# Patient Record
Sex: Female | Born: 1990 | Race: Black or African American | Marital: Single | Smoking: Never smoker
Health system: Southern US, Community
[De-identification: ages and names within clinical notes are randomized; demographics above are authoritative.]

## PROBLEM LIST (undated history)

## (undated) DIAGNOSIS — Z789 Other specified health status: Secondary | ICD-10-CM

## (undated) HISTORY — PX: INDUCED ABORTION: SHX677

---

## 2018-03-11 DIAGNOSIS — R87810 Cervical high risk human papillomavirus (HPV) DNA test positive: Secondary | ICD-10-CM | POA: Insufficient documentation

## 2018-03-11 DIAGNOSIS — R87610 Atypical squamous cells of undetermined significance on cytologic smear of cervix (ASC-US): Secondary | ICD-10-CM | POA: Insufficient documentation

## 2018-05-27 NOTE — L&D Delivery Note (Signed)
Delivery Note FHR became difficult to hear.  I examined her and found fetal vertex at the introitus Dr Sarita Haver had just arrive to do a NICU consult with patient so the rest of her team was called and we waited to deliver until they were set up.   She pushed over 2-3 contractions to deliver spontaneously.  Baby delivered crying and with excellent tone.  Dr Sarita Haver suggested proceeding with delayed cord clamping. While we waiting, she examined the baby, dried him and put his hat on.  Cord clamped away from abdomen and double clamped and cut by FOB  At 7:18 AM a viable and healthy female was delivered via Vaginal, Spontaneous (Presentation: ;  ).  APGAR: , ; weight 2 lb 6.5 oz (1090 g).   Placenta status: spontaneous and grossly intact with 3 vessel Cord:  with the following complications: none              Cord pH: pending  Anesthesia:  Epidural  Episiotomy:  none Lacerations:  none Suture Repair: n/a Est. Blood Loss (mL):  150  Mom to postpartum.  Baby to Couplet care / Skin to Skin.  Lauren Hawkins 12/24/2018, 7:43 AM  HP Please schedule this patient for Postpartum visit in: 4 weeks with the following provider: Any provider For C/S patients schedule nurse incision check in weeks 2 weeks: no Low risk pregnancy complicated by: Preterm birth Delivery mode:  SVD Anticipated Birth Control:  other/unsure PP Procedures needed: None  Schedule Integrated BH visit: no

## 2018-09-01 ENCOUNTER — Encounter: Payer: Self-pay | Admitting: Advanced Practice Midwife

## 2018-09-09 ENCOUNTER — Other Ambulatory Visit: Payer: Self-pay

## 2018-09-09 ENCOUNTER — Encounter: Payer: Self-pay | Admitting: Obstetrics & Gynecology

## 2018-09-09 ENCOUNTER — Other Ambulatory Visit (HOSPITAL_COMMUNITY)
Admission: RE | Admit: 2018-09-09 | Discharge: 2018-09-09 | Disposition: A | Discharge status: Home / Self Care | Payer: 59 | Source: Ambulatory Visit | Attending: Advanced Practice Midwife | Admitting: Advanced Practice Midwife

## 2018-09-09 ENCOUNTER — Ambulatory Visit (INDEPENDENT_AMBULATORY_CARE_PROVIDER_SITE_OTHER): Payer: 59 | Admitting: Obstetrics & Gynecology

## 2018-09-09 DIAGNOSIS — Z113 Encounter for screening for infections with a predominantly sexual mode of transmission: Secondary | ICD-10-CM | POA: Diagnosis not present

## 2018-09-09 DIAGNOSIS — Z9889 Other specified postprocedural states: Secondary | ICD-10-CM | POA: Insufficient documentation

## 2018-09-09 DIAGNOSIS — Z8742 Personal history of other diseases of the female genital tract: Secondary | ICD-10-CM | POA: Diagnosis present

## 2018-09-09 DIAGNOSIS — O3680X0 Pregnancy with inconclusive fetal viability, not applicable or unspecified: Secondary | ICD-10-CM

## 2018-09-09 DIAGNOSIS — M5432 Sciatica, left side: Secondary | ICD-10-CM | POA: Insufficient documentation

## 2018-09-09 DIAGNOSIS — Z3400 Encounter for supervision of normal first pregnancy, unspecified trimester: Secondary | ICD-10-CM

## 2018-09-09 DIAGNOSIS — Z3A12 12 weeks gestation of pregnancy: Secondary | ICD-10-CM | POA: Diagnosis not present

## 2018-09-09 LAB — POCT URINALYSIS DIPSTICK OB
Bilirubin, UA: NEGATIVE
Blood, UA: NEGATIVE
Glucose, UA: NEGATIVE
Ketones, UA: NEGATIVE
Nitrite, UA: NEGATIVE
POC,PROTEIN,UA: NEGATIVE
Spec Grav, UA: 1.015 (ref 1.010–1.025)
pH, UA: 6.5 (ref 5.0–8.0)

## 2018-09-09 NOTE — Patient Instructions (Signed)
Sciatica    Sciatica is pain, numbness, weakness, or tingling along the path of the sciatic nerve. The sciatic nerve starts in the lower back and runs down the back of each leg. The nerve controls the muscles in the lower leg and in the back of the knee. It also provides feeling (sensation) to the back of the thigh, the lower leg, and the sole of the foot. Sciatica is a symptom of another medical condition that pinches or puts pressure on the sciatic nerve.  Generally, sciatica only affects one side of the body. Sciatica usually goes away on its own or with treatment. In some cases, sciatica may keep coming back (recur).  What are the causes?  This condition is caused by pressure on the sciatic nerve, or pinching of the sciatic nerve. This may be the result of:   A disk in between the bones of the spine (vertebrae) bulging out too far (herniated disk).   Age-related changes in the spinal disks (degenerative disk disease).   A pain disorder that affects a muscle in the buttock (piriformis syndrome).   Extra bone growth (bone spur) near the sciatic nerve.   An injury or break (fracture) of the pelvis.   Pregnancy.   Tumor (rare).  What increases the risk?  The following factors may make you more likely to develop this condition:   Playing sports that place pressure or stress on the spine, such as football or weight lifting.   Having poor strength and flexibility.   A history of back injury.   A history of back surgery.   Sitting for long periods of time.   Doing activities that involve repetitive bending or lifting.   Obesity.  What are the signs or symptoms?  Symptoms can vary from mild to very severe, and they may include:   Any of these problems in the lower back, leg, hip, or buttock:  ? Mild tingling or dull aches.  ? Burning sensations.  ? Sharp pains.   Numbness in the back of the calf or the sole of the foot.   Leg weakness.   Severe back pain that makes movement difficult.  These symptoms  may get worse when you cough, sneeze, or laugh, or when you sit or stand for long periods of time. Being overweight may also make symptoms worse. In some cases, symptoms may recur over time.  How is this diagnosed?  This condition may be diagnosed based on:   Your symptoms.   A physical exam. Your health care provider may ask you to do certain movements to check whether those movements trigger your symptoms.   You may have tests, including:  ? Blood tests.  ? X-rays.  ? MRI.  ? CT scan.  How is this treated?  In many cases, this condition improves on its own, without any treatment. However, treatment may include:   Reducing or modifying physical activity during periods of pain.   Exercising and stretching to strengthen your abdomen and improve the flexibility of your spine.   Icing and applying heat to the affected area.   Medicines that help:  ? To relieve pain and swelling.  ? To relax your muscles.   Injections of medicines that help to relieve pain, irritation, and inflammation around the sciatic nerve (steroids).   Surgery.  Follow these instructions at home:  Medicines   Take over-the-counter and prescription medicines only as told by your health care provider.   Do not drive or operate heavy   area before you exercise or as often as told by your health care provider. Use the heat source that your health care provider recommends, such as a moist heat pack or a heating pad. ? Place a towel between your skin and the heat source. ? Leave the heat on for 20-30 minutes. ? Remove the heat if your skin turns bright red. This is especially important if you are unable to feel pain, heat, or cold. You may have a greater risk  of getting burned. Activity  Return to your normal activities as told by your health care provider. Ask your health care provider what activities are safe for you. ? Avoid activities that make your symptoms worse.  Take brief periods of rest throughout the day. Resting in a lying or standing position is usually better than sitting to rest. ? When you rest for longer periods, mix in some mild activity or stretching between periods of rest. This will help to prevent stiffness and pain. ? Avoid sitting for long periods of time without moving. Get up and move around at least one time each hour.  Exercise and stretch regularly, as told by your health care provider.  Do not lift anything that is heavier than 10 lb (4.5 kg) while you have symptoms of sciatica. When you do not have symptoms, you should still avoid heavy lifting, especially repetitive heavy lifting.  When you lift objects, always use proper lifting technique, which includes: ? Bending your knees. ? Keeping the load close to your body. ? Avoiding twisting. General instructions  Use good posture. ? Avoid leaning forward while sitting. ? Avoid hunching over while standing.  Maintain a healthy weight. Excess weight puts extra stress on your back and makes it difficult to maintain good posture.  Wear supportive, comfortable shoes. Avoid wearing high heels.  Avoid sleeping on a mattress that is too soft or too hard. A mattress that is firm enough to support your back when you sleep may help to reduce your pain.  Keep all follow-up visits as told by your health care provider. This is important. Contact a health care provider if:  You have pain that wakes you up when you are sleeping.  You have pain that gets worse when you lie down.  Your pain is worse than you have experienced in the past.  Your pain lasts longer than 4 weeks.  You experience unexplained weight loss. Get help right away if:  You lose control of your  bowel or bladder (incontinence).  You have: ? Weakness in your lower back, pelvis, buttocks, or legs that gets worse. ? Redness or swelling of your back. ? A burning sensation when you urinate. This information is not intended to replace advice given to you by your health care provider. Make sure you discuss any questions you have with your health care provider. Document Released: 05/07/2001 Document Revised: 10/17/2015 Document Reviewed: 01/20/2015 Elsevier Interactive Patient Education  2019 [employer].    Second Trimester of Pregnancy The second trimester is from week 14 through week 27 (months 4 through 6). The second trimester is often a time when you feel your best. Your body has adjusted to being pregnant, and you begin to feel better physically. Usually, morning sickness has lessened or quit completely, you may have more energy, and you may have an increase in appetite. The second trimester is also a time when the fetus is growing rapidly. At the end of the sixth month, the fetus is about 9 inches  long and weighs about 1 pounds. You will likely begin to feel the baby move (quickening) between 16 and 20 weeks of pregnancy. Body changes during your second trimester Your body continues to go through many changes during your second trimester. The changes vary from woman to woman.  Your weight will continue to increase. You will notice your lower abdomen bulging out.  You may begin to get stretch marks on your hips, abdomen, and breasts.  You may develop headaches that can be relieved by medicines. The medicines should be approved by your health care provider.  You may urinate more often because the fetus is pressing on your bladder.  You may develop or continue to have heartburn as a result of your pregnancy.  You may develop constipation because certain hormones are causing the muscles that push waste through your intestines to slow down.  You may develop hemorrhoids or swollen,  bulging veins (varicose veins).  You may have back pain. This is caused by: ? Weight gain. ? Pregnancy hormones that are relaxing the joints in your pelvis. ? A shift in weight and the muscles that support your balance.  Your breasts will continue to grow and they will continue to become tender.  Your gums may bleed and may be sensitive to brushing and flossing.  Dark spots or blotches (chloasma, mask of pregnancy) may develop on your face. This will likely fade after the baby is born.  A dark line from your belly button to the pubic area (linea nigra) may appear. This will likely fade after the baby is born.  You may have changes in your hair. These can include thickening of your hair, rapid growth, and changes in texture. Some women also have hair loss during or after pregnancy, or hair that feels dry or thin. Your hair will most likely return to normal after your baby is born. What to expect at prenatal visits During a routine prenatal visit:  You will be weighed to make sure you and the fetus are growing normally.  Your blood pressure will be taken.  Your abdomen will be measured to track your baby's growth.  The fetal heartbeat will be listened to.  Any test results from the previous visit will be discussed. Your health care provider may ask you:  How you are feeling.  If you are feeling the baby move.  If you have had any abnormal symptoms, such as leaking fluid, bleeding, severe headaches, or abdominal cramping.  If you are using any tobacco products, including cigarettes, chewing tobacco, and electronic cigarettes.  If you have any questions. Other tests that may be performed during your second trimester include:  Blood tests that check for: ? Low iron levels (anemia). ? High blood sugar that affects pregnant women (gestational diabetes) between 724 and 28 weeks. ? Rh antibodies. This is to check for a protein on red blood cells (Rh factor).  Urine tests to check  for infections, diabetes, or protein in the urine.  An ultrasound to confirm the proper growth and development of the baby.  An amniocentesis to check for possible genetic problems.  Fetal screens for spina bifida and Down syndrome.  HIV (human immunodeficiency virus) testing. Routine prenatal testing includes screening for HIV, unless you choose not to have this test. Follow these instructions at home: Medicines  Follow your health care provider's instructions regarding medicine use. Specific medicines may be either safe or unsafe to take during pregnancy.  Take a prenatal vitamin that contains at least  600 micrograms (mcg) of folic acid.  If you develop constipation, try taking a stool softener if your health care provider approves. Eating and drinking   Eat a balanced diet that includes fresh fruits and vegetables, whole grains, good sources of protein such as meat, eggs, or tofu, and low-fat dairy. Your health care provider will help you determine the amount of weight gain that is right for you.  Avoid raw meat and uncooked cheese. These carry germs that can cause birth defects in the baby.  If you have low calcium intake from food, talk to your health care provider about whether you should take a daily calcium supplement.  Limit foods that are high in fat and processed sugars, such as fried and sweet foods.  To prevent constipation: ? Drink enough fluid to keep your urine clear or pale yellow. ? Eat foods that are high in fiber, such as fresh fruits and vegetables, whole grains, and beans. Activity  Exercise only as directed by your health care provider. Most women can continue their usual exercise routine during pregnancy. Try to exercise for 30 minutes at least 5 days a week. Stop exercising if you experience uterine contractions.  Avoid heavy lifting, wear low heel shoes, and practice good posture.  A sexual relationship may be continued unless your health care provider  directs you otherwise. Relieving pain and discomfort  Wear a good support bra to prevent discomfort from breast tenderness.  Take warm sitz baths to soothe any pain or discomfort caused by hemorrhoids. Use hemorrhoid cream if your health care provider approves.  Rest with your legs elevated if you have leg cramps or low back pain.  If you develop varicose veins, wear support hose. Elevate your feet for 15 minutes, 3-4 times a day. Limit salt in your diet. Prenatal Care  Write down your questions. Take them to your prenatal visits.  Keep all your prenatal visits as told by your health care provider. This is important. Safety  Wear your seat belt at all times when driving.  Make a list of emergency phone numbers, including numbers for family, friends, the hospital, and police and fire departments. General instructions  Ask your health care provider for a referral to a local prenatal education class. Begin classes no later than the beginning of month 6 of your pregnancy.  Ask for help if you have counseling or nutritional needs during pregnancy. Your health care provider can offer advice or refer you to specialists for help with various needs.  Do not use hot tubs, steam rooms, or saunas.  Do not douche or use tampons or scented sanitary pads.  Do not cross your legs for long periods of time.  Avoid cat litter boxes and soil used by cats. These carry germs that can cause birth defects in the baby and possibly loss of the fetus by miscarriage or stillbirth.  Avoid all smoking, herbs, alcohol, and unprescribed drugs. Chemicals in these products can affect the formation and growth of the baby.  Do not use any products that contain nicotine or tobacco, such as cigarettes and e-cigarettes. If you need help quitting, ask your health care provider.  Visit your dentist if you have not gone yet during your pregnancy. Use a soft toothbrush to brush your teeth and be gentle when you  floss. Contact a health care provider if:  You have dizziness.  You have mild pelvic cramps, pelvic pressure, or nagging pain in the abdominal area.  You have persistent nausea, vomiting, or  diarrhea.  You have a bad smelling vaginal discharge.  You have pain when you urinate. Get help right away if:  You have a fever.  You are leaking fluid from your vagina.  You have spotting or bleeding from your vagina.  You have severe abdominal cramping or pain.  You have rapid weight gain or weight loss.  You have shortness of breath with chest pain.  You notice sudden or extreme swelling of your face, hands, ankles, feet, or legs.  You have not felt your baby move in over an hour.  You have severe headaches that do not go away when you take medicine.  You have vision changes. Summary  The second trimester is from week 14 through week 27 (months 4 through 6). It is also a time when the fetus is growing rapidly.  Your body goes through many changes during pregnancy. The changes vary from woman to woman.  Avoid all smoking, herbs, alcohol, and unprescribed drugs. These chemicals affect the formation and growth your baby.  Do not use any tobacco products, such as cigarettes, chewing tobacco, and e-cigarettes. If you need help quitting, ask your health care provider.  Contact your health care provider if you have any questions. Keep all prenatal visits as told by your health care provider. This is important. This information is not intended to replace advice given to you by your health care provider. Make sure you discuss any questions you have with your health care provider. Document Released: 05/07/2001 Document Revised: 06/18/2016 Document Reviewed: 06/18/2016 Elsevier Interactive Patient Education  2019 [employer].

## 2018-09-09 NOTE — Progress Notes (Signed)
DATING AND VIABILITY SONOGRAM   Lauren Hawkins is a 28 y.o. year old G1P0 with LMP Patient's last menstrual period was 06/11/2018 (exact date). which would correlate to  [redacted]w[redacted]d weeks gestation.  She has regular menstrual cycles.   She is here today for a confirmatory initial sonogram.    GESTATION: SINGLETON     FETAL ACTIVITY:          Heart rate         160 bpm          The fetus is active.   ADNEXA: The ovaries are normal.   GESTATIONAL AGE AND  BIOMETRICS:  Gestational criteria: Estimated Date of Delivery: 03/18/19 by LMP now at [redacted]w[redacted]d  Previous Scans:0  HC           8.38 cm        13-4weeks  GSD           6.64 cm       13-0 weeks                                                                               AVERAGE EGA(BY THIS SCAN): 13.1 weeks  WORKING EDD( early ultrasound ):  03/18/2019     TECHNICIAN COMMENTS:  Patient informed that the ultrasound is considered a limited obstetric ultrasound and is not intended to be a complete ultrasound exam. Patient also informed that the ultrasound is not being completed with the intent of assessing for fetal or placental anomalies or any pelvic abnormalities. Explained that the purpose of today's ultrasound is to assess for fetal heart rate. Patient acknowledges the purpose of the exam and the limitations of the study.       Armandina Stammer 09/09/2018 3:51 PM

## 2018-09-09 NOTE — Progress Notes (Signed)
  Subjective:    Lauren Hawkins is being seen today for her first obstetrical visit.  This is not a planned pregnancy. She is at [redacted]w[redacted]d gestation. Her obstetrical history is significant for nausea- improved. Relationship with FOB: significant other. Patient does intend to breast feed. Pregnancy history fully reviewed.  Patient reports nausea.  Review of Systems:   Review of Systems  Objective:     BP (!) 113/55   Pulse 84   Temp 97.8 F (36.6 C)   Ht 5\' 4"  (1.626 m)   Wt 211 lb 0.6 oz (95.7 kg)   LMP 06/11/2018 (Exact Date)   BMI 36.22 kg/m  Physical Exam  Exam General Appearance:    Alert, cooperative, no distress, appears stated age  Head:    Normocephalic, without obvious abnormality, atraumatic  Eyes:    conjunctiva/corneas clear, EOM's intact, both eyes  Ears:    Normal external ear canals, both ears  Nose:   Nares normal, septum midline, mucosa normal, no drainage    or sinus tenderness  Throat:   Lips, mucosa, and tongue normal; teeth and gums normal  Neck:   Supple, symmetrical, trachea midline, no adenopathy;    thyroid:  no enlargement/tenderness/nodules  Back:     Symmetric, no curvature, ROM normal, no CVA tenderness     Chest Wall:    No tenderness or deformity     Breast Exam:    No tenderness, masses, or nipple abnormality  Abdomen:     Soft, non-tender, bowel sounds active all four quadrants,    no masses, no organomegaly  Genitalia:    Normal female without lesion, discharge or tenderness   Cervical polyp noted  Extremities:   Extremities normal, atraumatic, no cyanosis or edema  Pulses:   2+ and symmetric all extremities  Skin:   Skin color, texture, turgor normal, no rashes or lesions      Assessment:    Pregnancy: G1P0 Patient Active Problem List   Diagnosis Date Noted  . Encounter for supervision of normal first pregnancy 09/09/2018  Sciatica     Plan:     Initial labs drawn. Prenatal vitamins. Problem list reviewed and updated. AFP3  discussed: requested. Role of ultrasound in pregnancy discussed; fetal survey: requested. Amniocentesis discussed: not indicated. Follow up in 5 weeks. 60 min visit spent on counseling and coordination of care.   Willodean Rosenthal 09/09/2018

## 2018-09-11 LAB — OBSTETRIC PANEL, INCLUDING HIV
Antibody Screen: NEGATIVE
Basophils Absolute: 0 10*3/uL (ref 0.0–0.2)
Basos: 0 %
EOS (ABSOLUTE): 0.1 10*3/uL (ref 0.0–0.4)
Eos: 1 %
HIV Screen 4th Generation wRfx: NONREACTIVE
Hematocrit: 38.2 % (ref 34.0–46.6)
Hemoglobin: 13 g/dL (ref 11.1–15.9)
Hepatitis B Surface Ag: NEGATIVE
Immature Grans (Abs): 0 10*3/uL (ref 0.0–0.1)
Immature Granulocytes: 0 %
Lymphocytes Absolute: 2 10*3/uL (ref 0.7–3.1)
Lymphs: 19 %
MCH: 30 pg (ref 26.6–33.0)
MCHC: 34 g/dL (ref 31.5–35.7)
MCV: 88 fL (ref 79–97)
Monocytes Absolute: 0.6 10*3/uL (ref 0.1–0.9)
Monocytes: 6 %
Neutrophils Absolute: 7.6 10*3/uL — ABNORMAL HIGH (ref 1.4–7.0)
Neutrophils: 74 %
Platelets: 246 10*3/uL (ref 150–450)
RBC: 4.33 x10E6/uL (ref 3.77–5.28)
RDW: 13 % (ref 11.7–15.4)
RPR Ser Ql: NONREACTIVE
Rh Factor: POSITIVE
Rubella Antibodies, IGG: 6.45 index (ref >0.99)
WBC: 10.4 10*3/uL (ref 3.4–10.8)

## 2018-09-11 LAB — HEMOGLOBINOPATHY EVALUATION
Ferritin: 47 ng/mL (ref 15–150)
Hgb A2 Quant: 2.5 % (ref 1.8–3.2)
Hgb A: 97.5 % (ref 96.4–98.8)
Hgb C: 0 %
Hgb F Quant: 0 % (ref 0.0–2.0)
Hgb S: 0 %
Hgb Solubility: NEGATIVE
Hgb Variant: 0 %

## 2018-09-11 LAB — HEMOGLOBIN A1C
Est. average glucose Bld gHb Est-mCnc: 94 mg/dL
Hgb A1c MFr Bld: 4.9 % (ref 4.8–5.6)

## 2018-09-12 LAB — CULTURE, OB URINE

## 2018-09-12 LAB — GC/CHLAMYDIA PROBE AMP (~~LOC~~) NOT AT ARMC
Chlamydia: NEGATIVE
Neisseria Gonorrhea: NEGATIVE

## 2018-09-12 LAB — URINE CULTURE, OB REFLEX

## 2018-10-01 ENCOUNTER — Other Ambulatory Visit: Payer: Self-pay

## 2018-10-01 ENCOUNTER — Ambulatory Visit (INDEPENDENT_AMBULATORY_CARE_PROVIDER_SITE_OTHER): Payer: 59 | Admitting: Family Medicine

## 2018-10-01 VITALS — BP 101/54 | HR 109 | Wt 213.0 lb

## 2018-10-01 DIAGNOSIS — Z3400 Encounter for supervision of normal first pregnancy, unspecified trimester: Secondary | ICD-10-CM

## 2018-10-01 DIAGNOSIS — M5432 Sciatica, left side: Secondary | ICD-10-CM

## 2018-10-01 DIAGNOSIS — Z3A16 16 weeks gestation of pregnancy: Secondary | ICD-10-CM

## 2018-10-01 DIAGNOSIS — M9903 Segmental and somatic dysfunction of lumbar region: Secondary | ICD-10-CM

## 2018-10-01 DIAGNOSIS — Z3402 Encounter for supervision of normal first pregnancy, second trimester: Secondary | ICD-10-CM

## 2018-10-01 NOTE — Progress Notes (Signed)
   PRENATAL VISIT NOTE  Subjective:  Lauren Hawkins is a 28 y.o. G1P0 at [redacted]w[redacted]d being seen today for ongoing prenatal care.  She is currently monitored for the following issues for this low-risk pregnancy and has Encounter for supervision of normal first pregnancy; Sciatica of left side; and S/P cervical polypectomy on their problem list.  Patient reports left sciatic pain, radiate to lower buttock..  Contractions: Not present. Vag. Bleeding: None.  Movement: Absent. Denies leaking of fluid.   The following portions of the patient's history were reviewed and updated as appropriate: allergies, current medications, past family history, past medical history, past social history, past surgical history and problem list. Problem list updated.  Objective:   Vitals:   10/01/18 1502  BP: (!) 101/54  Pulse: (!) 109  Weight: 213 lb (96.6 kg)    Fetal Status: Fetal Heart Rate (bpm): 162   Movement: Absent     General:  Alert, oriented and cooperative. Patient is in no acute distress.  Skin: Skin is warm and dry. No rash noted.   Cardiovascular: Normal heart rate noted  Respiratory: Normal respiratory effort, no problems with respiration noted  Abdomen: Soft, gravid, appropriate for gestational age. Pain/Pressure: Absent     Pelvic:  Cervical exam deferred        MSK: Restriction, tenderness, tissue texture changes, and paraspinal spasm in the lumbar paraspinal  Neuro: Moves all four extremities with no focal neurological deficit  Extremities: Normal range of motion.  Edema: None  Mental Status: Normal mood and affect. Normal behavior. Normal judgment and thought content.   OSE: Head   Cervical   Thoracic   Rib   Lumbar L1 ESRL, L5 ESRL  Sacrum L/L  Pelvis Right ant innom    Assessment and Plan:  Pregnancy: G1P0 at [redacted]w[redacted]d  1. Supervision of normal first pregnancy, antepartum Panorama today - Genetic Screening  2. Sciatica of left side 3. Somatic dysfunction of lumbar region OMT done  after patient permission. HVLA technique utilized. 3 areas treated with improvement of tissue texture and joint mobility. Patient tolerated procedure well.     Preterm labor symptoms and general obstetric precautions including but not limited to vaginal bleeding, contractions, leaking of fluid and fetal movement were reviewed in detail with the patient. Please refer to After Visit Summary for other counseling recommendations.  Return in about 6 weeks (around 11/12/2018) for Virtual, OB f/u.  Levie Heritage, DO

## 2018-10-12 ENCOUNTER — Telehealth: Payer: Self-pay

## 2018-10-12 NOTE — Telephone Encounter (Signed)
Patient called and made aware of low risk panorama.  Patient will come by and pick up results in sealed envelope. Armandina Stammer RN

## 2018-10-14 ENCOUNTER — Encounter: Payer: 59 | Admitting: Obstetrics & Gynecology

## 2018-10-15 ENCOUNTER — Encounter: Payer: 59 | Admitting: Obstetrics & Gynecology

## 2018-10-22 ENCOUNTER — Encounter (HOSPITAL_COMMUNITY): Payer: Self-pay

## 2018-10-22 ENCOUNTER — Ambulatory Visit (HOSPITAL_COMMUNITY): Payer: 59

## 2018-10-26 ENCOUNTER — Other Ambulatory Visit: Payer: Self-pay

## 2018-10-26 ENCOUNTER — Other Ambulatory Visit: Payer: Self-pay | Admitting: Obstetrics & Gynecology

## 2018-10-26 ENCOUNTER — Ambulatory Visit (HOSPITAL_COMMUNITY)
Admission: RE | Admit: 2018-10-26 | Discharge: 2018-10-26 | Disposition: A | Discharge status: Home / Self Care | Payer: 59 | Source: Ambulatory Visit | Attending: Obstetrics and Gynecology | Admitting: Obstetrics and Gynecology

## 2018-10-26 DIAGNOSIS — Z3400 Encounter for supervision of normal first pregnancy, unspecified trimester: Secondary | ICD-10-CM | POA: Diagnosis present

## 2018-10-26 DIAGNOSIS — Z363 Encounter for antenatal screening for malformations: Secondary | ICD-10-CM

## 2018-10-26 DIAGNOSIS — O3442 Maternal care for other abnormalities of cervix, second trimester: Secondary | ICD-10-CM

## 2018-10-26 DIAGNOSIS — Z3686 Encounter for antenatal screening for cervical length: Secondary | ICD-10-CM

## 2018-10-26 DIAGNOSIS — Z3A19 19 weeks gestation of pregnancy: Secondary | ICD-10-CM

## 2018-10-26 DIAGNOSIS — O99212 Obesity complicating pregnancy, second trimester: Secondary | ICD-10-CM | POA: Diagnosis not present

## 2018-10-27 ENCOUNTER — Other Ambulatory Visit (HOSPITAL_COMMUNITY): Payer: Self-pay | Admitting: *Deleted

## 2018-10-27 DIAGNOSIS — Z362 Encounter for other antenatal screening follow-up: Secondary | ICD-10-CM

## 2018-11-13 ENCOUNTER — Ambulatory Visit (INDEPENDENT_AMBULATORY_CARE_PROVIDER_SITE_OTHER): Payer: 59 | Admitting: Family Medicine

## 2018-11-13 ENCOUNTER — Encounter: Payer: Self-pay | Admitting: Family Medicine

## 2018-11-13 VITALS — BP 102/69 | Wt 220.0 lb

## 2018-11-13 DIAGNOSIS — Z3402 Encounter for supervision of normal first pregnancy, second trimester: Secondary | ICD-10-CM

## 2018-11-13 DIAGNOSIS — Z3400 Encounter for supervision of normal first pregnancy, unspecified trimester: Secondary | ICD-10-CM

## 2018-11-13 DIAGNOSIS — Z3A22 22 weeks gestation of pregnancy: Secondary | ICD-10-CM

## 2018-11-13 MED ORDER — CLOTRIMAZOLE 1 % EX CREA: 1.0000 "application " | TOPICAL_CREAM | Freq: Two times a day (BID) | CUTANEOUS | 0 refills | Status: DC

## 2018-11-13 NOTE — Progress Notes (Signed)
   PRENATAL VISIT NOTE  Subjective:  Lauren Hawkins is a 28 y.o. G1P0 at [redacted]w[redacted]d being seen today for ongoing prenatal care.  She is currently monitored for the following issues for this low-risk pregnancy and has Encounter for supervision of normal first pregnancy; Sciatica of left side; and S/P cervical polypectomy on their problem list.  Patient reports lower abdominal and pelvic pain - worse when standing and walking.  Contractions: Not present. Vag. Bleeding: None.  Movement: Present. Denies leaking of fluid.   The following portions of the patient's history were reviewed and updated as appropriate: allergies, current medications, past family history, past medical history, past social history, past surgical history and problem list.   Objective:   Vitals:   11/13/18 1050  BP: 102/69  Weight: 220 lb (99.8 kg)    Fetal Status:     Movement: Present     General:  Alert, oriented and cooperative. Patient is in no acute distress.  Skin: Skin is warm and dry. No rash noted.   Cardiovascular: Normal heart rate noted  Respiratory: Normal respiratory effort, no problems with respiration noted  Abdomen: Soft, gravid, appropriate for gestational age.  Pain/Pressure: Present     Pelvic: Cervical exam deferred        Extremities: Normal range of motion.  Edema: None  Mental Status: Normal mood and affect. Normal behavior. Normal judgment and thought content.   Assessment and Plan:  Pregnancy: G1P0 at [redacted]w[redacted]d 1. Supervision of normal first pregnancy, antepartum FHT and FH normal. Pregnancy belt recommended.  Preterm labor symptoms and general obstetric precautions including but not limited to vaginal bleeding, contractions, leaking of fluid and fetal movement were reviewed in detail with the patient. Please refer to After Visit Summary for other counseling recommendations.   Return in about 4 weeks (around 12/11/2018) for OB f/u, 2 hr GTT, In Office.  Future Appointments  Date Time Provider  [place]  11/24/2018  3:30 PM [place] [facility]  11/24/2018  3:30 PM [place] [facility]    Truett Mainland, DO

## 2018-11-24 ENCOUNTER — Ambulatory Visit (HOSPITAL_COMMUNITY): Payer: 59

## 2018-11-24 ENCOUNTER — Ambulatory Visit (HOSPITAL_COMMUNITY)
Admission: RE | Admit: 2018-11-24 | Discharge: 2018-11-24 | Disposition: A | Discharge status: Home / Self Care | Payer: 59 | Source: Ambulatory Visit | Attending: Obstetrics and Gynecology | Admitting: Obstetrics and Gynecology

## 2018-11-24 ENCOUNTER — Encounter (HOSPITAL_COMMUNITY): Payer: Self-pay

## 2018-12-07 ENCOUNTER — Ambulatory Visit (HOSPITAL_COMMUNITY)
Admission: RE | Admit: 2018-12-07 | Discharge: 2018-12-07 | Disposition: A | Discharge status: Home / Self Care | Payer: 59 | Source: Ambulatory Visit | Attending: Obstetrics and Gynecology | Admitting: Obstetrics and Gynecology

## 2018-12-07 ENCOUNTER — Encounter (HOSPITAL_COMMUNITY): Payer: Self-pay | Admitting: *Deleted

## 2018-12-07 ENCOUNTER — Ambulatory Visit (HOSPITAL_COMMUNITY): Payer: 59 | Admitting: *Deleted

## 2018-12-07 ENCOUNTER — Other Ambulatory Visit: Payer: Self-pay

## 2018-12-07 DIAGNOSIS — O99212 Obesity complicating pregnancy, second trimester: Secondary | ICD-10-CM | POA: Diagnosis not present

## 2018-12-07 DIAGNOSIS — Z8742 Personal history of other diseases of the female genital tract: Secondary | ICD-10-CM

## 2018-12-07 DIAGNOSIS — Z9889 Other specified postprocedural states: Secondary | ICD-10-CM | POA: Diagnosis present

## 2018-12-07 DIAGNOSIS — Z362 Encounter for other antenatal screening follow-up: Secondary | ICD-10-CM | POA: Insufficient documentation

## 2018-12-07 DIAGNOSIS — M5432 Sciatica, left side: Secondary | ICD-10-CM | POA: Diagnosis present

## 2018-12-07 DIAGNOSIS — Z3400 Encounter for supervision of normal first pregnancy, unspecified trimester: Secondary | ICD-10-CM | POA: Insufficient documentation

## 2018-12-07 DIAGNOSIS — Z3A25 25 weeks gestation of pregnancy: Secondary | ICD-10-CM

## 2018-12-07 DIAGNOSIS — O3442 Maternal care for other abnormalities of cervix, second trimester: Secondary | ICD-10-CM

## 2018-12-11 ENCOUNTER — Encounter: Payer: 59 | Admitting: Obstetrics & Gynecology

## 2018-12-15 ENCOUNTER — Other Ambulatory Visit: Payer: Self-pay

## 2018-12-15 ENCOUNTER — Encounter: Payer: Self-pay | Admitting: Advanced Practice Midwife

## 2018-12-15 ENCOUNTER — Ambulatory Visit (INDEPENDENT_AMBULATORY_CARE_PROVIDER_SITE_OTHER): Payer: 59 | Admitting: Advanced Practice Midwife

## 2018-12-15 VITALS — BP 107/65 | HR 89 | Wt 226.0 lb

## 2018-12-15 DIAGNOSIS — Z3400 Encounter for supervision of normal first pregnancy, unspecified trimester: Secondary | ICD-10-CM

## 2018-12-15 DIAGNOSIS — Z3A26 26 weeks gestation of pregnancy: Secondary | ICD-10-CM

## 2018-12-15 DIAGNOSIS — Z3402 Encounter for supervision of normal first pregnancy, second trimester: Secondary | ICD-10-CM

## 2018-12-15 NOTE — Progress Notes (Signed)
   PRENATAL VISIT NOTE  Subjective:  Lauren Hawkins is a 28 y.o. G1P0 at [redacted]w[redacted]d being seen today for ongoing prenatal care.  She is currently monitored for the following issues for this low-risk pregnancy and has Encounter for supervision of normal first pregnancy; Sciatica of left side; and S/P cervical polypectomy on their problem list.  Patient reports no complaints.  Contractions: Not present. Vag. Bleeding: None.  Movement: Present. Denies leaking of fluid.   The following portions of the patient's history were reviewed and updated as appropriate: allergies, current medications, past family history, past medical history, past social history, past surgical history and problem list.   Objective:   Vitals:   12/15/18 0956  BP: 107/65  Pulse: 89  Weight: 102.5 kg    Fetal Status:     Movement: Present     General:  Alert, oriented and cooperative. Patient is in no acute distress.  Skin: Skin is warm and dry. No rash noted.   Cardiovascular: Normal heart rate noted  Respiratory: Normal respiratory effort, no problems with respiration noted  Abdomen: Soft, gravid, appropriate for gestational age.  Pain/Pressure: Present     Pelvic: Cervical exam deferred        Extremities: Normal range of motion.  Edema: Trace  Mental Status: Normal mood and affect. Normal behavior. Normal judgment and thought content.   Assessment and Plan:  Pregnancy: G1P0 at [redacted]w[redacted]d . Preterm labor symptoms and general obstetric precautions including but not limited to vaginal bleeding, contractions, leaking of fluid and fetal movement were reviewed in detail with the patient. Please refer to After Visit Summary for other counseling recommendations.    RTO 01/13/2019 @ 1300hrs  Hansel Feinstein, CNM

## 2018-12-15 NOTE — Patient Instructions (Signed)

## 2018-12-16 LAB — GLUCOSE TOLERANCE, 2 HOURS W/ 1HR
Glucose, 1 hour: 136 mg/dL (ref 65–179)
Glucose, 2 hour: 90 mg/dL (ref 65–152)
Glucose, Fasting: 77 mg/dL (ref 65–91)

## 2018-12-16 LAB — CBC
Hematocrit: 39.1 % (ref 34.0–46.6)
Hemoglobin: 12.6 g/dL (ref 11.1–15.9)
MCH: 30.3 pg (ref 26.6–33.0)
MCHC: 32.2 g/dL (ref 31.5–35.7)
MCV: 94 fL (ref 79–97)
Platelets: 209 10*3/uL (ref 150–450)
RBC: 4.16 x10E6/uL (ref 3.77–5.28)
RDW: 13 % (ref 11.7–15.4)
WBC: 9.8 10*3/uL (ref 3.4–10.8)

## 2018-12-16 LAB — HIV ANTIBODY (ROUTINE TESTING W REFLEX): HIV Screen 4th Generation wRfx: NONREACTIVE

## 2018-12-16 LAB — RPR: RPR Ser Ql: NONREACTIVE

## 2018-12-22 ENCOUNTER — Other Ambulatory Visit: Payer: Self-pay

## 2018-12-22 ENCOUNTER — Encounter (HOSPITAL_COMMUNITY): Payer: Self-pay | Admitting: *Deleted

## 2018-12-22 ENCOUNTER — Inpatient Hospital Stay (EMERGENCY_DEPARTMENT_HOSPITAL)
Admission: AD | Admit: 2018-12-22 | Discharge: 2018-12-22 | Disposition: A | Discharge status: Home / Self Care | Payer: No Typology Code available for payment source | Source: Home / Self Care | Attending: Family Medicine | Admitting: Family Medicine

## 2018-12-22 DIAGNOSIS — Z3A27 27 weeks gestation of pregnancy: Secondary | ICD-10-CM

## 2018-12-22 DIAGNOSIS — B373 Candidiasis of vulva and vagina: Secondary | ICD-10-CM | POA: Insufficient documentation

## 2018-12-22 DIAGNOSIS — O98813 Other maternal infectious and parasitic diseases complicating pregnancy, third trimester: Secondary | ICD-10-CM | POA: Insufficient documentation

## 2018-12-22 DIAGNOSIS — O42913 Preterm premature rupture of membranes, unspecified as to length of time between rupture and onset of labor, third trimester: Secondary | ICD-10-CM | POA: Diagnosis not present

## 2018-12-22 DIAGNOSIS — B3731 Acute candidiasis of vulva and vagina: Secondary | ICD-10-CM

## 2018-12-22 DIAGNOSIS — Z0371 Encounter for suspected problem with amniotic cavity and membrane ruled out: Secondary | ICD-10-CM | POA: Diagnosis not present

## 2018-12-22 DIAGNOSIS — O26892 Other specified pregnancy related conditions, second trimester: Secondary | ICD-10-CM | POA: Insufficient documentation

## 2018-12-22 HISTORY — DX: Other specified health status: Z78.9

## 2018-12-22 LAB — AMNISURE RUPTURE OF MEMBRANE (ROM) NOT AT ARMC: Amnisure ROM: NEGATIVE

## 2018-12-22 LAB — WET PREP, GENITAL
Clue Cells Wet Prep HPF POC: NONE SEEN
Sperm: NONE SEEN
Trich, Wet Prep: NONE SEEN

## 2018-12-22 MED ORDER — TERCONAZOLE 0.4 % VA CREA: 1.0000 | TOPICAL_CREAM | Freq: Every day | VAGINAL | 0 refills | Status: DC

## 2018-12-22 NOTE — Discharge Instructions (Signed)
Vaginal Yeast infection, Adult  Vaginal yeast infection is a condition that causes vaginal discharge as well as soreness, swelling, and redness (inflammation) of the vagina. This is a common condition. Some women get this infection frequently. What are the causes? This condition is caused by a change in the normal balance of the yeast (candida) and bacteria that live in the vagina. This change causes an overgrowth of yeast, which causes the inflammation. What increases the risk? The condition is more likely to develop in women who:  Take antibiotic medicines.  Have diabetes.  Take birth control pills.  Are pregnant.  Douche often.  Have a weak body defense system (immune system).  Have been taking steroid medicines for a long time.  Frequently wear tight clothing. What are the signs or symptoms? Symptoms of this condition include:  White, thick, creamy vaginal discharge.  Swelling, itching, redness, and irritation of the vagina. The lips of the vagina (vulva) may be affected as well.  Pain or a burning feeling while urinating.  Pain during sex. How is this diagnosed? This condition is diagnosed based on:  Your medical history.  A physical exam.  A pelvic exam. Your health care provider will examine a sample of your vaginal discharge under a microscope. Your health care provider may send this sample for testing to confirm the diagnosis. How is this treated? This condition is treated with medicine. Medicines may be over-the-counter or prescription. You may be told to use one or more of the following:  Medicine that is taken by mouth (orally).  Medicine that is applied as a cream (topically).  Medicine that is inserted directly into the vagina (suppository). Follow these instructions at home:  Lifestyle  Do not have sex until your health care provider approves. Tell your sex partner that you have a yeast infection. That person should go to his or her health care  provider and ask if they should also be treated.  Do not wear tight clothes, such as pantyhose or tight pants.  Wear breathable cotton underwear. General instructions  Take or apply over-the-counter and prescription medicines only as told by your health care provider.  Eat more yogurt. This may help to keep your yeast infection from returning.  Do not use tampons until your health care provider approves.  Try taking a sitz bath to help with discomfort. This is a warm water bath that is taken while you are sitting down. The water should only come up to your hips and should cover your buttocks. Do this 3-4 times per day or as told by your health care provider.  Do not douche.  If you have diabetes, keep your blood sugar levels under control.  Keep all follow-up visits as told by your health care provider. This is important. Contact a health care provider if:  You have a fever.  Your symptoms go away and then return.  Your symptoms do not get better with treatment.  Your symptoms get worse.  You have new symptoms.  You develop blisters in or around your vagina.  You have blood coming from your vagina and it is not your menstrual period.  You develop pain in your abdomen. Summary  Vaginal yeast infection is a condition that causes discharge as well as soreness, swelling, and redness (inflammation) of the vagina.  This condition is treated with medicine. Medicines may be over-the-counter or prescription.  Take or apply over-the-counter and prescription medicines only as told by your health care provider.  Do not douche.   Do not have sex or use tampons until your health care provider approves.  Contact a health care provider if your symptoms do not get better with treatment or your symptoms go away and then return. This information is not intended to replace advice given to you by your health care provider. Make sure you discuss any questions you have with your health care  provider. Document Released: 02/20/2005 Document Revised: 09/29/2017 Document Reviewed: 09/29/2017 Elsevier Patient Education  2020 Elsevier Inc[employer]  

## 2018-12-22 NOTE — MAU Provider Note (Addendum)
History     CSN: [id number]  Arrival date and time: 12/22/18 3419   First Provider Initiated Contact with Patient 12/22/18 2032      Chief Complaint  Patient presents with  . Rupture of Membranes   HPI   [name] is a 28 y.o. female G2P0010 at [redacted]w[redacted]d here with suspected ROM that occurred around 1730 pm. The first gush was not as much, however the second gush was a lot. Says she has had to wear a pad due to the fluid/discharge. She denies vaginal bleeding or abdominal pain. + fetal movement.   OB History    Gravida  2   Para      Term      Preterm      AB  1   Living  0     SAB      TAB  1   Ectopic      Multiple      Live Births  0           Past Medical History:  Diagnosis Date  . Medical history non-contributory     Past Surgical History:  Procedure Laterality Date  . INDUCED ABORTION      Family History  Problem Relation Age of Onset  . Hypertension Mother     Social History   Tobacco Use  . Smoking status: Never Smoker  . Smokeless tobacco: Never Used  Substance Use Topics  . Alcohol use: Not Currently  . Drug use: Not Currently    Allergies: No Known Allergies  Medications Prior to Admission  Medication Sig Dispense Refill Last Dose  . Prenatal Vit-Fe Fumarate-FA (PRENATAL VITAMINS PO) Take by mouth.   12/21/2018 at Unknown time  . clotrimazole (LOTRIMIN) 1 % cream Apply 1 application topically 2 (two) times daily. 30 g 0 Unknown at Unknown time   Results for orders placed or performed during the hospital encounter of 12/22/18 (from the past 48 hour(s))  Wet prep, genital     Status: Abnormal   Collection Time: 12/22/18  8:43 PM  Result Value Ref Range   Yeast Wet Prep HPF POC PRESENT (A) NONE SEEN   Trich, Wet Prep NONE SEEN NONE SEEN   Clue Cells Wet Prep HPF POC NONE SEEN NONE SEEN   WBC, Wet Prep HPF POC MANY (A) NONE SEEN   Sperm NONE SEEN     Comment: Performed at [facility] [address]  [place], [place] [address]  Amnisure rupture of membrane (rom)not at [facility]     Status: None   Collection Time: 12/22/18  8:50 PM  Result Value Ref Range   Amnisure ROM NEGATIVE     Comment: Performed at [facility], [address], [place], [place] [address]    Review of Systems  Constitutional: Negative for fever.  Gastrointestinal: Negative for abdominal pain.  Genitourinary: Positive for vaginal discharge.   Physical Exam   Blood pressure 114/65, pulse (!) 105, temperature 98.6 F (37 C), temperature source Oral, resp. rate 20, height 5\' 4"  (1.626 m), weight 101.8 kg, last menstrual period 06/11/2018.  Physical Exam  Constitutional: She is oriented to person, place, and time. She appears well-developed and well-nourished. No distress.  HENT:  Head: Normocephalic.  Eyes: Pupils are equal, round, and reactive to light.  Genitourinary:    Genitourinary Comments: Vagina - Small amount of thin/white/clear vaginal discharge, no odor, no pooling  Cervix - No contact bleeding, no active bleeding, appears closed.  Bimanual exam: deferred  wet prep done, amnisure collected.  Chaperone present for exam.    Neurological: She is alert and oriented to person, place, and time.  Skin: Skin is warm. She is not diaphoretic.  Psychiatric: Her behavior is normal.   Fetal Tracing: Baseline: 145 bpm Variability: Moderate  Accelerations: 15x15 Decelerations: variable  Toco: UI  MAU Course  Procedures  None  MDM  Wet prep collected and shows candida.  Amnisure collected and negative.  Fern slide negative.   Assessment and Plan   A:  1. Encounter for suspected premature rupture of amniotic membranes, with rupture of membranes not found   2. Yeast vaginitis   3. [redacted] weeks gestation of pregnancy     P:  Discharge home in stable condition Rx: Terazol 7 Return to MAU if symptoms persist or worsen   Rasch, Harolyn RutherfordJennifer I, NP 12/23/2018 8:13 AM

## 2018-12-22 NOTE — MAU Note (Signed)
PT SAYS  SROM AT 525PM- WAS STANDING  THEN SAT ON BED-  FELT A GUSH- WENT TO B-ROOM- AND STILL FEELS FLUID COMING OUT  WITH  CLINIC .    NO PAIN.  LAST SEX- 2 WEEKS AGO.

## 2018-12-24 ENCOUNTER — Encounter (HOSPITAL_COMMUNITY): Payer: Self-pay

## 2018-12-24 ENCOUNTER — Other Ambulatory Visit: Payer: Self-pay

## 2018-12-24 ENCOUNTER — Inpatient Hospital Stay (HOSPITAL_COMMUNITY)
Admission: AD | Admit: 2018-12-24 | Discharge: 2018-12-26 | DRG: 807 | Disposition: A | Discharge status: Home / Self Care | Payer: No Typology Code available for payment source | Attending: Obstetrics and Gynecology | Admitting: Obstetrics and Gynecology

## 2018-12-24 ENCOUNTER — Inpatient Hospital Stay (HOSPITAL_COMMUNITY): Payer: No Typology Code available for payment source | Admitting: Anesthesiology

## 2018-12-24 DIAGNOSIS — O42913 Preterm premature rupture of membranes, unspecified as to length of time between rupture and onset of labor, third trimester: Principal | ICD-10-CM | POA: Diagnosis present

## 2018-12-24 DIAGNOSIS — O42919 Preterm premature rupture of membranes, unspecified as to length of time between rupture and onset of labor, unspecified trimester: Secondary | ICD-10-CM

## 2018-12-24 DIAGNOSIS — Z880 Allergy status to penicillin: Secondary | ICD-10-CM | POA: Diagnosis not present

## 2018-12-24 DIAGNOSIS — E669 Obesity, unspecified: Secondary | ICD-10-CM | POA: Diagnosis present

## 2018-12-24 DIAGNOSIS — Z3A28 28 weeks gestation of pregnancy: Secondary | ICD-10-CM

## 2018-12-24 DIAGNOSIS — O99215 Obesity complicating the puerperium: Secondary | ICD-10-CM | POA: Diagnosis present

## 2018-12-24 DIAGNOSIS — Z20828 Contact with and (suspected) exposure to other viral communicable diseases: Secondary | ICD-10-CM | POA: Diagnosis present

## 2018-12-24 LAB — RAPID URINE DRUG SCREEN, HOSP PERFORMED
Amphetamines: NOT DETECTED
Barbiturates: NOT DETECTED
Benzodiazepines: NOT DETECTED
Cocaine: NOT DETECTED
Opiates: NOT DETECTED
Tetrahydrocannabinol: NOT DETECTED

## 2018-12-24 LAB — CBC
HCT: 36.5 % (ref 36.0–46.0)
Hemoglobin: 12.4 g/dL (ref 12.0–15.0)
MCH: 30.3 pg (ref 26.0–34.0)
MCHC: 34 g/dL (ref 30.0–36.0)
MCV: 89.2 fL (ref 80.0–100.0)
Platelets: 194 10*3/uL (ref 150–400)
RBC: 4.09 MIL/uL (ref 3.87–5.11)
RDW: 12.6 % (ref 11.5–15.5)
WBC: 16.4 10*3/uL — ABNORMAL HIGH (ref 4.0–10.5)
nRBC: 0 % (ref 0.0–0.2)

## 2018-12-24 LAB — TYPE AND SCREEN
ABO/RH(D): B POS
Antibody Screen: NEGATIVE

## 2018-12-24 LAB — ABO/RH: ABO/RH(D): B POS

## 2018-12-24 LAB — SARS CORONAVIRUS 2 BY RT PCR (HOSPITAL ORDER, PERFORMED IN ~~LOC~~ HOSPITAL LAB): SARS Coronavirus 2: NEGATIVE

## 2018-12-24 LAB — GROUP B STREP BY PCR: Group B strep by PCR: NEGATIVE

## 2018-12-24 LAB — RPR: RPR Ser Ql: NONREACTIVE

## 2018-12-24 LAB — POCT FERN TEST: POCT Fern Test: POSITIVE — AB

## 2018-12-24 MED ORDER — MAGNESIUM SULFATE 40 G IN LACTATED RINGERS - SIMPLE
2.0000 g/h | INTRAVENOUS | Status: DC
  Filled 2018-12-24: qty 500

## 2018-12-24 MED ORDER — BENZOCAINE-MENTHOL 20-0.5 % EX AERO
1.0000 "application " | INHALATION_SPRAY | CUTANEOUS | Status: DC | PRN
  Administered 2018-12-24: 1 via TOPICAL
  Filled 2018-12-24: qty 56

## 2018-12-24 MED ORDER — IBUPROFEN 600 MG PO TABS
600.0000 mg | ORAL_TABLET | Freq: Four times a day (QID) | ORAL | Status: DC
  Administered 2018-12-24 – 2018-12-26 (×7): 600 mg via ORAL
  Filled 2018-12-24 (×8): qty 1

## 2018-12-24 MED ORDER — ACETAMINOPHEN 325 MG PO TABS: 650.0000 mg | ORAL_TABLET | ORAL | Status: DC | PRN

## 2018-12-24 MED ORDER — OXYCODONE-ACETAMINOPHEN 5-325 MG PO TABS: 2.0000 | ORAL_TABLET | ORAL | Status: DC | PRN

## 2018-12-24 MED ORDER — OXYTOCIN BOLUS FROM INFUSION
500.0000 mL | Freq: Once | INTRAVENOUS | Status: AC
  Administered 2018-12-24: 500 mL via INTRAVENOUS

## 2018-12-24 MED ORDER — LACTATED RINGERS IV BOLUS
1000.0000 mL | Freq: Once | INTRAVENOUS | Status: AC
  Administered 2018-12-24: 1000 mL via INTRAVENOUS

## 2018-12-24 MED ORDER — LIDOCAINE HCL (PF) 1 % IJ SOLN: 30.0000 mL | INTRAMUSCULAR | Status: DC | PRN

## 2018-12-24 MED ORDER — DIPHENHYDRAMINE HCL 50 MG/ML IJ SOLN: 12.5000 mg | INTRAMUSCULAR | Status: DC | PRN

## 2018-12-24 MED ORDER — COCONUT OIL OIL
1.0000 "application " | TOPICAL_OIL | Status: DC | PRN
  Administered 2018-12-24: 1 via TOPICAL

## 2018-12-24 MED ORDER — FENTANYL-BUPIVACAINE-NACL 0.5-0.125-0.9 MG/250ML-% EP SOLN
EPIDURAL | Status: AC
  Filled 2018-12-24: qty 250

## 2018-12-24 MED ORDER — ZOLPIDEM TARTRATE 5 MG PO TABS: 5.0000 mg | ORAL_TABLET | Freq: Every evening | ORAL | Status: DC | PRN

## 2018-12-24 MED ORDER — BETAMETHASONE SOD PHOS & ACET 6 (3-3) MG/ML IJ SUSP
12.0000 mg | Freq: Once | INTRAMUSCULAR | Status: AC
  Administered 2018-12-24: 12 mg via INTRAMUSCULAR
  Filled 2018-12-24: qty 2

## 2018-12-24 MED ORDER — SOD CITRATE-CITRIC ACID 500-334 MG/5ML PO SOLN: 30.0000 mL | ORAL | Status: DC | PRN

## 2018-12-24 MED ORDER — LIDOCAINE HCL (PF) 1 % IJ SOLN
INTRAMUSCULAR | Status: DC | PRN
  Administered 2018-12-24 (×2): 4 mL via EPIDURAL

## 2018-12-24 MED ORDER — FENTANYL-BUPIVACAINE-NACL 0.5-0.125-0.9 MG/250ML-% EP SOLN: 12.0000 mL/h | EPIDURAL | Status: DC | PRN

## 2018-12-24 MED ORDER — OXYCODONE-ACETAMINOPHEN 5-325 MG PO TABS: 1.0000 | ORAL_TABLET | ORAL | Status: DC | PRN

## 2018-12-24 MED ORDER — EPHEDRINE 5 MG/ML INJ: 10.0000 mg | INTRAVENOUS | Status: DC | PRN

## 2018-12-24 MED ORDER — PROMETHAZINE HCL 25 MG/ML IJ SOLN
12.5000 mg | Freq: Once | INTRAMUSCULAR | Status: AC
  Administered 2018-12-24: 12.5 mg via INTRAVENOUS
  Filled 2018-12-24: qty 1

## 2018-12-24 MED ORDER — AZITHROMYCIN 500 MG PO TABS: 500.0000 mg | ORAL_TABLET | Freq: Every day | ORAL | Status: DC

## 2018-12-24 MED ORDER — PHENYLEPHRINE 40 MCG/ML (10ML) SYRINGE FOR IV PUSH (FOR BLOOD PRESSURE SUPPORT): 80.0000 ug | PREFILLED_SYRINGE | INTRAVENOUS | Status: DC | PRN

## 2018-12-24 MED ORDER — DIBUCAINE (PERIANAL) 1 % EX OINT: 1.0000 "application " | TOPICAL_OINTMENT | CUTANEOUS | Status: DC | PRN

## 2018-12-24 MED ORDER — PRENATAL MULTIVITAMIN CH
1.0000 | ORAL_TABLET | Freq: Every day | ORAL | Status: DC
  Administered 2018-12-24 – 2018-12-26 (×3): 1 via ORAL
  Filled 2018-12-24 (×3): qty 1

## 2018-12-24 MED ORDER — MAGNESIUM SULFATE BOLUS VIA INFUSION
6.0000 g | Freq: Once | INTRAVENOUS | Status: AC
  Administered 2018-12-24: 6 g via INTRAVENOUS
  Filled 2018-12-24: qty 500

## 2018-12-24 MED ORDER — LACTATED RINGERS IV SOLN: INTRAVENOUS | Status: DC

## 2018-12-24 MED ORDER — LACTATED RINGERS IV SOLN: 500.0000 mL | INTRAVENOUS | Status: DC | PRN

## 2018-12-24 MED ORDER — LACTATED RINGERS IV SOLN: 500.0000 mL | Freq: Once | INTRAVENOUS | Status: DC

## 2018-12-24 MED ORDER — ONDANSETRON HCL 4 MG/2ML IJ SOLN: 4.0000 mg | INTRAMUSCULAR | Status: DC | PRN

## 2018-12-24 MED ORDER — SENNOSIDES-DOCUSATE SODIUM 8.6-50 MG PO TABS
2.0000 | ORAL_TABLET | ORAL | Status: DC
  Administered 2018-12-24: 2 via ORAL
  Filled 2018-12-24 (×2): qty 2

## 2018-12-24 MED ORDER — OXYTOCIN 40 UNITS IN NORMAL SALINE INFUSION - SIMPLE MED
2.5000 [IU]/h | INTRAVENOUS | Status: DC
  Filled 2018-12-24: qty 1000

## 2018-12-24 MED ORDER — WITCH HAZEL-GLYCERIN EX PADS: 1.0000 "application " | MEDICATED_PAD | CUTANEOUS | Status: DC | PRN

## 2018-12-24 MED ORDER — AMOXICILLIN 500 MG PO CAPS: 500.0000 mg | ORAL_CAPSULE | Freq: Three times a day (TID) | ORAL | Status: DC

## 2018-12-24 MED ORDER — TETANUS-DIPHTH-ACELL PERTUSSIS 5-2.5-18.5 LF-MCG/0.5 IM SUSP
0.5000 mL | Freq: Once | INTRAMUSCULAR | Status: AC
  Administered 2018-12-25: 0.5 mL via INTRAMUSCULAR
  Filled 2018-12-24: qty 0.5

## 2018-12-24 MED ORDER — SODIUM CHLORIDE 0.9 % IV SOLN
500.0000 mg | INTRAVENOUS | Status: DC
  Administered 2018-12-24: 500 mg via INTRAVENOUS
  Filled 2018-12-24: qty 500

## 2018-12-24 MED ORDER — FENTANYL CITRATE (PF) 100 MCG/2ML IJ SOLN: 100.0000 ug | INTRAMUSCULAR | Status: DC | PRN

## 2018-12-24 MED ORDER — SIMETHICONE 80 MG PO CHEW: 80.0000 mg | CHEWABLE_TABLET | ORAL | Status: DC | PRN

## 2018-12-24 MED ORDER — SODIUM CHLORIDE (PF) 0.9 % IJ SOLN
INTRAMUSCULAR | Status: DC | PRN
  Administered 2018-12-24: 12 mL/h via EPIDURAL

## 2018-12-24 MED ORDER — ONDANSETRON HCL 4 MG PO TABS: 4.0000 mg | ORAL_TABLET | ORAL | Status: DC | PRN

## 2018-12-24 MED ORDER — ONDANSETRON HCL 4 MG/2ML IJ SOLN: 4.0000 mg | Freq: Four times a day (QID) | INTRAMUSCULAR | Status: DC | PRN

## 2018-12-24 MED ORDER — SODIUM CHLORIDE 0.9 % IV SOLN
2.0000 g | Freq: Four times a day (QID) | INTRAVENOUS | Status: DC
  Administered 2018-12-24: 2 g via INTRAVENOUS
  Filled 2018-12-24: qty 2000

## 2018-12-24 MED ORDER — DIPHENHYDRAMINE HCL 25 MG PO CAPS: 25.0000 mg | ORAL_CAPSULE | Freq: Four times a day (QID) | ORAL | Status: DC | PRN

## 2018-12-24 NOTE — Anesthesia Postprocedure Evaluation (Signed)
Anesthesia Post Note  Patient: Lauren Hawkins  Procedure(s) Performed: AN AD HOC LABOR EPIDURAL     Patient location during evaluation: Mother Baby Anesthesia Type: Epidural Level of consciousness: awake and alert Pain management: pain level controlled Vital Signs Assessment: post-procedure vital signs reviewed and stable Respiratory status: spontaneous breathing, nonlabored ventilation and respiratory function stable Cardiovascular status: stable Postop Assessment: no headache, no backache and epidural receding Anesthetic complications: no    Last Vitals:  Vitals:   12/24/18 0853 12/24/18 1003  BP: (!) 113/59 (!) 95/57  Pulse: (!) 102 (!) 117  Resp:  18  Temp: 37.4 C 37.3 C  SpO2: 99% 99%    Last Pain:  Vitals:   12/24/18 1003  TempSrc: Oral  PainSc:    Pain Goal: Patients Stated Pain Goal: 3 (12/24/18 1002)                 Demetrica Zipp

## 2018-12-24 NOTE — MAU Note (Signed)
Reports feeling contractions that she hasn't timed but they feel close.  Reports having some gushes of fluid since the other day when she was seen.  Currently being treated for a yeast infection.  No VB.  + FM.  Reports no complications w/ her pregnancy up to this point.

## 2018-12-24 NOTE — H&P (Addendum)
Lauren Hawkins is a 28 y.o. female, G2P0010 at 28 weeks, presenting for abdominal pain and leaking of fluid.  Patient states she has had continuous intermittent lower abdominal cramping that has become increasingly worse throughout the day. She states she has been leaking since 12/22/2018 and has been having intermittent gushes.  Patient reports taking Terazol this evening.  She rates the pain a 7/10 and states she has not taken any medication.  Patient endorses fetal movement and denies vaginal bleeding.   [facility] and was supervised for a low risk pregnancy. Pregnancy and medical history significant for problems as listed below. Her GBS is unknown.    Patient Active Problem List   Diagnosis Date Noted  . Indication for care or intervention in labor or delivery 12/24/2018  . Encounter for supervision of normal first pregnancy 09/09/2018  . Sciatica of left side 09/09/2018  . S/P cervical polypectomy 09/09/2018    History of present pregnancy:  Last evaluation:  In MAU by Blanche EastJ. Rasch on December 21, 2017  Nursing Staff Provider  Office Location  [facility]  Dating    Language  English  Anatomy US  WNL structure in cervical canal. Suspect polyp  Flu Vaccine   Genetic Screen  NIPS: Low Risk Female  AFP:       TDaP vaccine    Hgb A1C or  GTT Early  Third trimester   [id number] Normal  Rhogam     LAB RESULTS   Feeding Plan Breast feeding  Blood Type   B+  Contraception  Antibody  Neg  Circumcision Yes, if boy  Rubella   Immune  Pediatrician   RPR    NR  Support Person Lauren Hawkins(FOB) HBsAg   Neg  Prenatal Classes  HIV  NR  BTL Consent  GBS  (For PCN allergy, check sensitivities)   VBAC Consent  Pap     Hgb Electro   Hgb AA    CF     SMA     Waterbirth  [ ]  Class [ ]  Consent [ ]  CNM visit      OB History    Gravida  2   Para      Term      Preterm      AB  1   Living  0     SAB      TAB  1   Ectopic      Multiple      Live Births  0              Past Medical History:  Diagnosis Date  . Medical history non-contributory    Past Surgical History:  Procedure Laterality Date  . INDUCED ABORTION     Family History: family history includes Hypertension in her mother. Social History:  reports that she has never smoked. She has never used smokeless tobacco. She reports previous alcohol use. She reports previous drug use.   Prenatal Transfer Tool  Maternal Diabetes: No Genetic Screening: Declined Maternal Ultrasounds/Referrals: Normal Fetal Ultrasounds or other Referrals:  None Maternal Substance Abuse:  No Significant Maternal Medications:  None Significant Maternal Lab Results: None   Maternal Assessment:  ROS: +Contractions, +LOF, -Vaginal Bleeding, +Fetal Movement  All other systems reviewed and negative.    No Known Allergies   Dilation: 4 Exam by:: Gerrit HeckJessica Renee Erb, CNM Blood pressure 121/71, pulse (!) 105, temperature 99.3 F (37.4 C), temperature source Oral, last menstrual period 06/11/2018.  Physical Exam  Constitutional:  She is oriented to person, place, and time. She appears well-developed and well-nourished. She appears distressed (with contractions).  HENT:  Head: Normocephalic and atraumatic.  Eyes: Conjunctivae are normal.  Neck: Normal range of motion.  Cardiovascular: Normal rate.  Respiratory: Effort normal.  GI: Soft.  Genitourinary:    Vagina normal.     Genitourinary Comments: Sterile Speculum Exam: NEFG with clear fluid noted from introitus. -Vaginal Vault: Pink mucosa.  Moderate amt pinkish white fluid vs discharge noted -Fern collected -Cervix:  Appears dilated to ~4cm.  Fetal hair noted from os. -Bimanual Exam: Dilation: 4 Presentation: Vertex Exam by:: Gavin Pound, CNM     Musculoskeletal: Normal range of motion.  Neurological: She is alert and oriented to person, place, and time.  Skin: Skin is warm and dry.  Psychiatric: She has a normal mood and affect. Her behavior is  normal.   Fern Positive Fetal Assessment: -Presentation: Vertex  FHR: 155 bpm, Mod Var, -Decels, +10x10 Accels UCs:  Irritability    Assessment IUP at 28 weeks Cat I FT  pPROM Contractions  Plan: Dr. Elonda Husky consulted and advises  *Admission to labor and delivery *MgSO4 Bolus at 6g f/b 2g *Give BMZ Dosing *Covid Testing -M. Jimmye Norman, CNM contacted and informed of admission. -Will start latency antibiotics -Standard L&D Orders placed   Loann Quill, MSN 12/24/2018, 5:17 AM

## 2018-12-24 NOTE — Lactation Note (Signed)
This note was copied from a baby's chart. Lactation Consultation Note  Patient Name: Lauren Hawkins [id number] Date: 12/24/2018 Reason for consult: Initial assessment;Primapara;1st time breastfeeding;NICU baby;Infant < 6lbs;Preterm <34wks  P1 Mom noted to be asleep.  Baby delivered vaginally at 28 weeks and was transferred to NICU.  Baby 6 hrs old and weighed 2 lbs 6oz at birth.  Mom opened her eyes and stated that she was too tired to start pumping.  DEBP was set up by her RN.  Mom doesn't have a pump at home, recommended she call her insurance carrier about obtaining a pump through them.  Mom told about pump rental program in the gift shop, and how baby's room will have the same pump here at her bedside.  NICU booklet let with Mom, along with Breastfeeding brochure.   Told Mom that we would be checking on her daily, and she can call for assistance with pumping.  Encouraged her to begin pumping as soon as she can.   RN notified of how sleepy Mom is.   Lactation Tools Discussed/Used WIC Program: No Pump Review: Setup, frequency, and cleaning;Milk Storage Initiated by:: [place] RN Date initiated:: 12/24/18   Consult Status Consult Status: Follow-up Date: 12/25/18 Follow-up type: In-patient    Broadus John 12/24/2018, 1:32 PM

## 2018-12-24 NOTE — Consult Note (Addendum)
Neonatology Note:   Attendance at Delivery:    I was asked by M. Jimmye Norman, CNM for Dr. Elonda Husky to attend this NSVD at 28 0/7 weeks. The mother is a G2P0A1 B pos, GBS unknown with prenatal care in [facility], no complications to date. ROM on 7/28, fluid clear; onset of preterm labor followed. Mother presented to MAU and got a dose of Betamethasone, 1 dose each of Ampicillin and Azithromycin, and a Magnesium sulfate bolus about 2 hours before delivery. Labor was precipitous, mother afebrile. Infant vigorous with good spontaneous cry and tone. Delayed cord clamping was done. Needed only minimal bulb suctioning. Dried and placed the baby into the portawarmer bag, pulse oximeter placed. HR > 100 throughout, good movement. We applied CPAP right away to support the baby's respiratory effort. Some subcostal retractions were noted, which improved with CPAP. Ap 8/9. I spoke with the parents briefly, then transported the baby to the NICU on CPAP and about 40% FIO2 for further care, with his father in attendance.   Real Cons, MD

## 2018-12-24 NOTE — Progress Notes (Signed)
RN assisted patient with breast pumping.

## 2018-12-24 NOTE — Anesthesia Procedure Notes (Signed)
Epidural Patient location during procedure: OB Start time: 12/24/2018 6:20 AM End time: 12/24/2018 6:28 AM  Staffing Anesthesiologist: Josephine Igo, MD Performed: anesthesiologist   Preanesthetic Checklist Completed: patient identified, site marked, surgical consent, pre-op evaluation, timeout performed, IV checked, risks and benefits discussed and monitors and equipment checked  Epidural Patient position: sitting Prep: site prepped and draped and DuraPrep Patient monitoring: continuous pulse ox and blood pressure Approach: midline Location: L3-L4 Injection technique: LOR air  Needle:  Needle type: Tuohy  Needle gauge: 17 G Needle length: 9 cm and 9 Needle insertion depth: 6 cm Catheter type: closed end flexible Catheter size: 19 Gauge Catheter at skin depth: 11 cm Test dose: negative and Other  Assessment Events: blood not aspirated, injection not painful, no injection resistance, negative IV test and no paresthesia  Additional Notes Patient identified. Risks and benefits discussed including failed block, incomplete  Pain control, post dural puncture headache, nerve damage, paralysis, blood pressure Changes, nausea, vomiting, reactions to medications-both toxic and allergic and post Partum back pain. All questions were answered. Patient expressed understanding and wished to proceed. Sterile technique was used throughout procedure. Epidural site was Dressed with sterile barrier dressing. No paresthesias, signs of intravascular injection Or signs of intrathecal spread were encountered.  Patient was more comfortable after the epidural was dosed. Please see RN's note for documentation of vital signs and FHR which are stable.  Reason for block:procedure for pain

## 2018-12-24 NOTE — Plan of Care (Signed)
  Problem: Education: Goal: Knowledge of condition will improve Outcome: Progressing   Problem: Health Behavior/Discharge Planning: Goal: Ability to manage health-related needs will improve Outcome: Completed/Met   Problem: Clinical Measurements: Goal: Diagnostic test results will improve Outcome: Completed/Met Goal: Cardiovascular complication will be avoided Outcome: Completed/Met   Problem: Activity: Goal: Risk for activity intolerance will decrease Outcome: Completed/Met   Problem: Coping: Goal: Level of anxiety will decrease Outcome: Completed/Met   Problem: Elimination: Goal: Will not experience complications related to bowel motility Outcome: Completed/Met Goal: Will not experience complications related to urinary retention Outcome: Completed/Met   Problem: Pain Managment: Goal: General experience of comfort will improve Outcome: Completed/Met   Problem: Safety: Goal: Ability to remain free from injury will improve Outcome: Completed/Met   Problem: Skin Integrity: Goal: Risk for impaired skin integrity will decrease Outcome: Completed/Met   Problem: Activity: Goal: Will verbalize the importance of balancing activity with adequate rest periods Outcome: Completed/Met Goal: Ability to tolerate increased activity will improve Outcome: Completed/Met   Problem: Coping: Goal: Ability to identify and utilize available resources and services will improve Outcome: Completed/Met   Problem: Clinical Measurements: Goal: Respiratory complications will improve Outcome: Not Applicable   Problem: Skin Integrity: Goal: Demonstration of wound healing without infection will improve Outcome: Not Applicable

## 2018-12-24 NOTE — Discharge Summary (Signed)
Postpartum Discharge Summary     Patient Name: Lauren Hawkins DOB: 05/14/1991 MRN: [id number]  Date of admission: 12/24/2018 Delivering Provider: Aviva SignsWILLIAMS, MARIE Hawkins   Date of discharge: 12/26/2018  Admitting diagnosis: PPROM, preterm labor Gestational Age at Admission: 28/0 Secondary diagnosis: None Additional problems: none     Discharge diagnosis: Preterm Pregnancy Delivered and PPROM                                                                                                Post partum procedures:none  Augmentation: none  Complications: None  Hospital course:  Onset of Labor With Vaginal Delivery     28 y.o. yo G2P0010 at 672w0d was admitted in active labor on 12/24/2018. Patient had an uncomplicated labor course as follows:  Presented to [facility] with PPROM and PTL at 4cm Got epidural and Magnesium Sultate, antibiotics, and betamethasone Delivered soon thereafter Membrane Rupture Time/Date:  ,12/22/2018   Intrapartum Procedures: Episiotomy: None [1]                                         Lacerations:  None [1]  Patient had a delivery of a Viable infant. 12/24/2018  Information for the patient's newborn:  Lauren Hawkins [[id number]][[id number]]       Pateint had an uncomplicated postpartum course.  She is ambulating, tolerating a regular diet, passing flatus, and urinating well. Patient is discharged home in stable condition on 12/26/18.   Magnesium Sulfate recieved: Yes BMZ received: Yes  Physical exam  Vitals:   12/25/18 1954 12/25/18 2330 12/26/18 0422 12/26/18 0909  BP: 102/60 (!) 113/56 108/69 (!) 105/57  Pulse: 92 93 95 84  Resp: 17 17 17 18   Temp: 98 F (36.7 C) 98.3 F (36.8 C) 98.2 F (36.8 C) 98.6 F (37 C)  TempSrc: Oral Oral Oral Oral  SpO2: 100% 100% 100% 99%  Weight:      Height:       General: alert Lochia: appropriate Uterine Fundus: soft, nttp Incision: N/A DVT Evaluation: No evidence of DVT seen on physical exam. Labs: CBC Latest Ref Rng & Units  12/24/2018 12/15/2018 09/09/2018  WBC 4.0 - 10.5 K/uL 16.4(H) 9.8 10.4  Hemoglobin 12.0 - 15.0 g/dL 44.012.4 10.212.6 72.513.0  Hematocrit 36.0 - 46.0 % 36.5 39.1 38.2  Platelets 150 - 400 K/uL 194 209 246   Conflict (See Lab Report): B POS/B POS Performed at [facility], [address] [place], [place] [address]  Negative: RPR, COVID, UDS, GBS PCR  Discharge instruction: per After Visit Summary and "Baby and Me Booklet".  After visit meds:  Allergies as of 12/26/2018   No Known Allergies     Medication List    TAKE these medications   acetaminophen 325 MG tablet Commonly known as: Tylenol Take 2 tablets (650 mg total) by mouth every 4 (four) hours as needed (for pain scale < 4).   ibuprofen 600 MG tablet Commonly known as: ADVIL Take 1 tablet (600 mg total) by  mouth every 6 (six) hours as needed.   prenatal multivitamin Tabs tablet Take 1 tablet by mouth daily at 12 noon.   terconazole 0.4 % vaginal cream Commonly known as: Terazol 7 Place 1 applicator vaginally at bedtime.       Diet: routine diet  Activity: Advance as tolerated. Pelvic rest for 6 weeks.   Outpatient follow up:4 weeks Follow up Appt: Future Appointments  Date Time Provider [place]  01/25/2019  9:30 AM Lavonia Drafts, MD [id number] None   Follow up Visit: [place]. Go in 30 day(s).   Specialty: Obstetrics and Gynecology Contact information: [address] [place] [id number] [phone]           HP Please schedule this patient for Postpartum visit in: 4 weeks with the following provider: Any provider For C/S patients schedule nurse incision check in weeks 2 weeks: no Low risk pregnancy complicated by: Preterm birth Delivery mode:  SVD Anticipated Birth Control:  other/unsure PP Procedures needed: None  Schedule Integrated BH visit: no     Newborn Data: Live born  female  Birth Weight: 2 lb 6.5 oz (1090 g) APGAR: 8, 9  Newborn Delivery   Birth date/time: 12/24/2018 07:18:00 Delivery type: Vaginal, Spontaneous      Baby Feeding: Breast Disposition:NICU   12/26/2018 Aletha Halim, MD

## 2018-12-24 NOTE — Anesthesia Preprocedure Evaluation (Addendum)
Anesthesia Evaluation  Patient identified by MRN, date of birth, ID band Patient awake    Reviewed: Allergy & Precautions, Patient's Chart, lab work & pertinent test results  Airway Mallampati: II  TM Distance: >3 FB Neck ROM: Full    Dental no notable dental hx. (+) Teeth Intact   Pulmonary neg pulmonary ROS,    Pulmonary exam normal breath sounds clear to auscultation       Cardiovascular negative cardio ROS Normal cardiovascular exam Rhythm:Regular Rate:Normal     Neuro/Psych  Neuromuscular disease negative psych ROS   GI/Hepatic Neg liver ROS, GERD  ,  Endo/Other  Obesity  Renal/GU negative Renal ROS  negative genitourinary   Musculoskeletal Left Sciatica   Abdominal (+) + obese,   Peds  Hematology   Anesthesia Other Findings   Reproductive/Obstetrics (+) Pregnancy 28 weeks in labor SROM                            Anesthesia Physical Anesthesia Plan  ASA: II  Anesthesia Plan: Epidural   Post-op Pain Management:    Induction:   PONV Risk Score and Plan:   Airway Management Planned: Natural Airway  Additional Equipment:   Intra-op Plan:   Post-operative Plan:   Informed Consent: I have reviewed the patients History and Physical, chart, labs and discussed the procedure including the risks, benefits and alternatives for the proposed anesthesia with the patient or authorized representative who has indicated his/her understanding and acceptance.       Plan Discussed with: Anesthesiologist  Anesthesia Plan Comments:         Anesthesia Quick Evaluation

## 2018-12-25 NOTE — Plan of Care (Signed)
  Problem: Education: Goal: Knowledge of condition will improve Outcome: Progressing  Pt. Doing well postpartum, visited baby 1x last shift. VSS, voiding. No complaint of pain. Anticipated discharge tomorrow.

## 2018-12-25 NOTE — Clinical Social Work Maternal (Signed)
CLINICAL SOCIAL WORK MATERNAL/CHILD NOTE  Patient Details  Name: Lauren Hawkins MRN: [id number] Date of Birth: 17-Nov-1990  Date:  12/25/2018  Clinical Social Worker Initiating Note:  Elijio Miles Date/Time: Initiated:  12/25/18/1024     Child's Name:  Undecided   Biological Parents:  Mother, Father(Elexia Hawkins and Lauren Hawkins DOB: 06/13/1981)   Need for Interpreter:  None   Reason for Referral:  Parental Support of Premature Babies < 32 weeks/or Critically Ill babies, Other (Comment)(MOB scored 10 on Edinburgh)   Address:  [address] [place] [address]    Phone number:  [phone] (home)     Additional phone number:   Household Members/Support Persons (HM/SP):   Household Member/Support Person 1   HM/SP Name Relationship DOB or Age  HM/SP -Lauren Hawkins FOB 06/13/1981  HM/SP -2        HM/SP -3        HM/SP -4        HM/SP -5        HM/SP -6        HM/SP -7        HM/SP -8          Natural Supports (not living in the home):  Parent, Immediate Family, Spouse/significant other   Professional Supports: None   Employment: Full-time   Type of Work: Airline pilot   Education:  Forensic psychologist   Homebound arranged:    Museum/gallery curator Resources:  Multimedia programmer   Other Resources:      Cultural/Religious Considerations Which May Impact Care:    Strengths:  Ability to meet basic needs    Psychotropic Medications:         Pediatrician:       Pediatrician List:   [place]      Pediatrician Fax Number:    Risk Factors/Current Problems:      Cognitive State:  Able to Concentrate , Alert , Linear Thinking    Mood/Affect:  Calm , Comfortable , Sad    CSW Assessment:  CSW received consult for delayed bonding and Edinburgh Score of 10.  CSW met with MOB to offer support and complete assessment.    MOB resting in bed, when CSW entered the  room. MOB open to and welcoming of [place] visit. CSW introduced self and explained reason for consult to which MOB expressed understanding. MOB pleasant and attentive throughout assessment but appeared to be sad given infant's NICU admission. MOB reported that they had told her that infant was strong and that made her feel better. MOB shared with CSW that she had visited with infant twice since infant had been born. CSW inquired about how that experience was for MOB and MOB shared that "no one wants to see their baby like that". CSW normalized and validated MOB's feelings and inquired about MOB's support system to which MOB reported she had support from FOB, her mother and her brothers. MOB confirmed that she feels she is being well-informed with what is going on with infant's care and plans to visit with him later this morning.   MOB reported she currently lives with FOB in [place] and shared that she works full-time for [employer]. CSW inquired about if her work would allow her to take time off and MOB communicated that she spoke with her job the day prior and that  she would have time off. CSW inquired about MOB's mental health history and MOB denied having a history of anything but did acknowledge score of 10 on Edinburgh. CSW provided education regarding the baby blues period vs. perinatal mood disorders, discussed treatment and gave resources for mental health follow up if concerns arise. CSW recommended self-evaluation during the postpartum time period using the New Mom Checklist from Postpartum Progress and encouraged MOB to contact a medical professional if symptoms are noted at any time. CSW also explained emotions that come with infant being in the NICU and encouraged MOB to reach out if concerns arise and reported CSWs would check in with MOB regularly to assess for psychosocial stressors. MOB denied any current SI, HI or DV.   CSW informed MOB about the NICU, what to expect and resources/supports  available while infant is admitted to the NICU. CSW informed MOB about SSI and MOB reported she intends to apply. CSW inquired about transportation barriers to which MOB denied any at this time but is aware she can reach out if concerns arise. CSW explained to MOB that NICU CSW would check in with her this upcoming week to introduce themselves and review information provided today. MOB expressed understanding and denied any questions or concerns at this time.   CSW Plan/Description:  Psychosocial Support and Ongoing Assessment of Needs, Perinatal Mood and Anxiety Disorder (PMADs) Education, [employer] Income (SSI) Information    [place], [place] 12/25/2018, 11:28 AM

## 2018-12-25 NOTE — Progress Notes (Signed)
Post Partum Day 2 SVD d/t PTL and PTD Subjective: no complaints  Objective: Blood pressure (!) 104/59, pulse 100, temperature 98 F (36.7 C), temperature source Oral, resp. rate 18, height 5\' 4"  (1.626 m), weight 101.6 kg, last menstrual period 06/11/2018, SpO2 99 %.  Physical Exam:  General: alert Lochia: appropriate Uterine Fundus: firm Incision: healing well DVT Evaluation: No evidence of DVT seen on physical exam.  Recent Labs    12/24/18 0513  HGB 12.4  HCT 36.5    Assessment/Plan: Plan for discharge tomorrow   LOS: 1 day   Chancy Milroy 12/25/2018, 4:43 PM

## 2018-12-26 MED ORDER — ACETAMINOPHEN 325 MG PO TABS: 650.0000 mg | ORAL_TABLET | ORAL | Status: DC | PRN

## 2018-12-26 MED ORDER — PRENATAL MULTIVITAMIN CH: 1.0000 | ORAL_TABLET | Freq: Every day | ORAL | 3 refills | Status: DC

## 2018-12-26 MED ORDER — IBUPROFEN 600 MG PO TABS: 600.0000 mg | ORAL_TABLET | Freq: Four times a day (QID) | ORAL | 1 refills | Status: DC | PRN

## 2018-12-26 NOTE — Progress Notes (Signed)
Discharge instructions and prescriptions given to pt. Discussed post-vaginal delivery care, signs and symptoms to report to the MD, upcoming appointments, and meds. Pt verbalizes understanding and has no questions or concerns at this time. Pt discharged in stable condition. 

## 2018-12-26 NOTE — Discharge Instructions (Addendum)
Postpartum Baby Blues The postpartum period begins right after the birth of a baby. During this time, there is often a lot of joy and excitement. It is also a time of many changes in the life of the parents. No matter how many times a mother gives birth, each child brings new challenges to the family, including different ways of relating to one another. It is common to have feelings of excitement along with confusing changes in moods, emotions, and thoughts. You may feel happy one minute and sad or stressed the next. These feelings of sadness usually happen in the period right after you have your baby, and they go away within a week or two. This is called the "baby blues." What are the causes? There is no known cause of baby blues. It is likely caused by a combination of factors. However, changes in hormone levels after childbirth are believed to trigger some of the symptoms. Other factors that can play a role in these mood changes include:  Lack of sleep.  Stressful life events, such as poverty, caring for a loved one, or death of a loved one.  Genetics. What are the signs or symptoms? Symptoms of this condition include:  Brief changes in mood, such as going from extreme happiness to sadness.  Decreased concentration.  Difficulty sleeping.  Crying spells and tearfulness.  Loss of appetite.  Irritability.  Anxiety. If the symptoms of baby blues last for more than 2 weeks or become more severe, you may have postpartum depression. How is this diagnosed? This condition is diagnosed based on an evaluation of your symptoms. There are no medical or lab tests that lead to a diagnosis, but there are various questionnaires that a health care provider may use to identify women with the baby blues or postpartum depression. How is this treated? Treatment is not needed for this condition. The baby blues usually go away on their own in 1-2 weeks. Social support is often all that is needed. You will  be encouraged to get adequate sleep and rest. Follow these instructions at home: Lifestyle      Get as much rest as you can. Take a nap when the baby sleeps.  Exercise regularly as told by your health care provider. Some women find yoga and walking to be helpful.  Eat a balanced and nourishing diet. This includes plenty of fruits and vegetables, whole grains, and lean proteins.  Do little things that you enjoy. Have a cup of tea, take a bubble bath, read your favorite magazine, or listen to your favorite music.  Avoid alcohol.  Ask for help with household chores, cooking, grocery shopping, or running errands. Do not try to do everything yourself. Consider hiring a postpartum doula to help. This is a professional who specializes in providing support to new mothers.  Try not to make any major life changes during pregnancy or right after giving birth. This can add stress. General instructions  Talk to people close to you about how you are feeling. Get support from your partner, family members, friends, or other new moms. You may want to join a support group.  Find ways to cope with stress. This may include: ? Writing your thoughts and feelings in a journal. ? Spending time outside. ? Spending time with people who make you laugh.  Try to stay positive in how you think. Think about the things you are grateful for.  Take over-the-counter and prescription medicines only as told by your health care provider.    Let your health care provider know if you have any concerns.  Keep all postpartum visits as told by your health care provider. This is important. Contact a health care provider if:  Your baby blues do not go away after 2 weeks. Get help right away if:  You have thoughts of taking your own life (suicidal thoughts).  You think you may harm the baby or other people.  You see or hear things that are not there (hallucinations). Summary  After giving birth, you may feel happy  one minute and sad or stressed the next. Feelings of sadness that happen right after the baby is born and go away after a week or two are called the "baby blues."  You can manage the baby blues by getting enough rest, eating a healthy diet, exercising, spending time with supportive people, and finding ways to cope with stress.  If feelings of sadness and stress last longer than 2 weeks or get in the way of caring for your baby, talk to your health care provider. This may mean you have postpartum depression. This information is not intended to replace advice given to you by your health care provider. Make sure you discuss any questions you have with your health care provider. Document Released: 02/15/2004 Document Revised: 09/04/2018 Document Reviewed: 07/09/2016 Elsevier Patient Education  [place]. Preeclampsia and Eclampsia Preeclampsia is a serious condition that may develop during pregnancy. This condition causes high blood pressure and increased protein in your urine along with other symptoms, such as headaches and vision changes. These symptoms may develop as the condition gets worse. Preeclampsia may occur at 20 weeks of pregnancy or later. Diagnosing and treating preeclampsia early is very important. If not treated early, it can cause serious problems for you and your baby. One problem it can lead to is eclampsia. Eclampsia is a condition that causes muscle jerking or shaking (convulsions or seizures) and other serious problems for the mother. During pregnancy, delivering your baby may be the best treatment for preeclampsia or eclampsia. For most women, preeclampsia and eclampsia symptoms go away after giving birth. In rare cases, a woman may develop preeclampsia after giving birth (postpartum preeclampsia). This usually occurs within 48 hours after childbirth but may occur up to 6 weeks after giving birth. What are the causes? The cause of preeclampsia is not known. What increases  the risk? The following risk factors make you more likely to develop preeclampsia:  Being pregnant for the first time.  Having had preeclampsia during a past pregnancy.  Having a family history of preeclampsia.  Having high blood pressure.  Being pregnant with more than one baby.  Being 55 or older.  Being African-American.  Having kidney disease or diabetes.  Having medical conditions such as lupus or blood diseases.  Being very overweight (obese). What are the signs or symptoms? The most common symptoms are:  Severe headaches.  Vision problems, such as blurred or double vision.  Abdominal pain, especially upper abdominal pain. Other symptoms that may develop as the condition gets worse include:  Sudden weight gain.  Sudden swelling of the hands, face, legs, and feet.  Severe nausea and vomiting.  Numbness in the face, arms, legs, and feet.  Dizziness.  Urinating less than usual.  Slurred speech.  Convulsions or seizures. How is this diagnosed? There are no screening tests for preeclampsia. Your health care provider will ask you about symptoms and check for signs of preeclampsia during your prenatal visits. You may also have  tests that include:  Checking your blood pressure.  Urine tests to check for protein. Your health care provider will check for this at every prenatal visit.  Blood tests.  Monitoring your baby's heart rate.  Ultrasound. How is this treated? You and your health care provider will determine the treatment approach that is best for you. Treatment may include:  Having more frequent prenatal exams to check for signs of preeclampsia, if you have an increased risk for preeclampsia.  Medicine to lower your blood pressure.  Staying in the hospital, if your condition is severe. There, treatment will focus on controlling your blood pressure and the amount of fluids in your body (fluid retention).  Taking medicine (magnesium sulfate) to  prevent seizures. This may be given as an injection or through an IV.  Taking a low-dose aspirin during your pregnancy.  Delivering your baby early. You may have your labor started with medicine (induced), or you may have a cesarean delivery. Follow these instructions at home: Eating and drinking   Drink enough fluid to keep your urine pale yellow.  Avoid caffeine. Lifestyle  Do not use any products that contain nicotine or tobacco, such as cigarettes and e-cigarettes. If you need help quitting, ask your health care provider.  Do not use alcohol or drugs.  Avoid stress as much as possible. Rest and get plenty of sleep. General instructions  Take over-the-counter and prescription medicines only as told by your health care provider.  When lying down, lie on your left side. This keeps pressure off your major blood vessels.  When sitting or lying down, raise (elevate) your feet. Try putting some pillows underneath your lower legs.  Exercise regularly. Ask your health care provider what kinds of exercise are best for you.  Keep all follow-up and prenatal visits as told by your health care provider. This is important. How is this prevented? There is no known way of preventing preeclampsia or eclampsia from developing. However, to lower your risk of complications and detect problems early:  Get regular prenatal care. Your health care provider may be able to diagnose and treat the condition early.  Maintain a healthy weight. Ask your health care provider for help managing weight gain during pregnancy.  Work with your health care provider to manage any long-term (chronic) health conditions you have, such as diabetes or kidney problems.  You may have tests of your blood pressure and kidney function after giving birth.  Your health care provider may have you take low-dose aspirin during your next pregnancy. Contact a health care provider if:  You have symptoms that your health care  provider told you may require more treatment or monitoring, such as: ? Headaches. ? Nausea or vomiting. ? Abdominal pain. ? Dizziness. ? Light-headedness. Get help right away if:  You have severe: ? Abdominal pain. ? Headaches that do not get better. ? Dizziness. ? Vision problems. ? Confusion. ? Nausea or vomiting.  You have any of the following: ? A seizure. ? Sudden, rapid weight gain. ? Sudden swelling in your hands, ankles, or face. ? Trouble moving any part of your body. ? Numbness in any part of your body. ? Trouble speaking. ? Abnormal bleeding.  You faint. Summary  Preeclampsia is a serious condition that may develop during pregnancy.  This condition causes high blood pressure and increased protein in your urine along with other symptoms, such as headaches and vision changes.  Diagnosing and treating preeclampsia early is very important. If not treated early,  it can cause serious problems for you and your baby.  Get help right away if you have symptoms that your health care provider told you to watch for. This information is not intended to replace advice given to you by your health care provider. Make sure you discuss any questions you have with your health care provider. Document Released: 05/10/2000 Document Revised: 01/13/2018 Document Reviewed: 12/18/2015 Elsevier Patient Education  2020 Elsevier Inc.  Vaginal Delivery, Care After Refer to this sheet in the next few weeks. These discharge instructions provide you with information on caring for yourself after delivery. Your caregiver may also give you specific instructions. Your treatment has been planned according to the most current medical practices available, but problems sometimes occur. Call your caregiver if you have any problems or questions after you go home. HOME CARE INSTRUCTIONS 1. Take over-the-counter or prescription medicines only as directed by your caregiver or pharmacist. 2. Do not drink  alcohol, especially if you are breastfeeding or taking medicine to relieve pain. 3. Do not smoke tobacco. 4. Continue to use good perineal care. Good perineal care includes: 1. Wiping your perineum from back to front 2. Keeping your perineum clean. 3. You can do sitz baths twice a day, to help keep this area clean 5. Do not use tampons, douche or have sex until your caregiver says it is okay. 6. Shower only and avoid sitting in submerged water, aside from sitz baths 7. Wear a well-fitting bra that provides breast support. 8. Eat healthy foods. 9. Drink enough fluids to keep your urine clear or pale yellow. 10. Eat high-fiber foods such as whole grain cereals and breads, brown rice, beans, and fresh fruits and vegetables every day. These foods may help prevent or relieve constipation. 11. Avoid constipation with high fiber foods or medications, such as miralax or metamucil 12. Follow your caregiver's recommendations regarding resumption of activities such as climbing stairs, driving, lifting, exercising, or traveling. 13. Talk to your caregiver about resuming sexual activities. Resumption of sexual activities is dependent upon your risk of infection, your rate of healing, and your comfort and desire to resume sexual activity. 14. Try to have someone help you with your household activities and your newborn for at least a few days after you leave the hospital. 15. Rest as much as possible. Try to rest or take a nap when your newborn is sleeping. 16. Increase your activities gradually. 17. Keep all of your scheduled postpartum appointments. It is very important to keep your scheduled follow-up appointments. At these appointments, your caregiver will be checking to make sure that you are healing physically and emotionally. SEEK MEDICAL CARE IF:   You are passing large clots from your vagina. Save any clots to show your caregiver.  You have a foul smelling discharge from your vagina.  You have  trouble urinating.  You are urinating frequently.  You have pain when you urinate.  You have a change in your bowel movements.  You have increasing redness, pain, or swelling near your vaginal incision (episiotomy) or vaginal tear.  You have pus draining from your episiotomy or vaginal tear.  Your episiotomy or vaginal tear is separating.  You have painful, hard, or reddened breasts.  You have a severe headache.  You have blurred vision or see spots.  You feel sad or depressed.  You have thoughts of hurting yourself or your newborn.  You have questions about your care, the care of your newborn, or medicines.  You are dizzy or  light-headed.  You have a rash.  You have nausea or vomiting.  You were breastfeeding and have not had a menstrual period within 12 weeks after you stopped breastfeeding.  You are not breastfeeding and have not had a menstrual period by the 12th week after delivery.  You have a fever. SEEK IMMEDIATE MEDICAL CARE IF:   You have persistent pain.  You have chest pain.  You have shortness of breath.  You faint.  You have leg pain.  You have stomach pain.  Your vaginal bleeding saturates two or more sanitary pads in 1 hour. MAKE SURE YOU:   Understand these instructions.  Will watch your condition.  Will get help right away if you are not doing well or get worse. Document Released: 05/10/2000 Document Revised: 09/27/2013 Document Reviewed: 01/08/2012 [facility] Patient Information 2015 [place], [place]. This information is not intended to replace advice given to you by your health care provider. Make sure you discuss any questions you have with your health care provider.  Sitz Bath A sitz bath is a warm water bath taken in the sitting position. The water covers only the hips and butt (buttocks). We recommend using one that fits in the toilet, to help with ease of use and cleanliness. It may be used for either healing or cleaning purposes.  Sitz baths are also used to relieve pain, itching, or muscle tightening (spasms). The water may contain medicine. Moist heat will help you heal and relax.  HOME CARE  Take 3 to 4 sitz baths a day. 18. Fill the bathtub half-full with warm water. 19. Sit in the water and open the drain a little. 20. Turn on the warm water to keep the tub half-full. Keep the water running constantly. 21. Soak in the water for 15 to 20 minutes. 22. After the sitz bath, pat the affected area dry. GET HELP RIGHT AWAY IF: You get worse instead of better. Stop the sitz baths if you get worse. MAKE SURE YOU:  Understand these instructions.  Will watch your condition.  Will get help right away if you are not doing well or get worse. Document Released: 06/20/2004 Document Revised: 02/05/2012 Document Reviewed: 09/10/2010 [facility]Care Patient Information 2015 [place], [place]. This information is not intended to replace advice given to you by your health care provider. Make sure you discuss any questions you have with your health care provider.

## 2018-12-26 NOTE — Lactation Note (Signed)
This note was copied from a baby's chart. Lactation Consultation Note  Patient Name: Lauren Hawkins [id number] Date: 12/26/2018   Consult attempted, as Dr. Ilda Basset had just left room, but Mom lying in bed with eyes closed & did not open eyes when I greeted her (although she did respond). This [place] left room to allow Mom more time to sleep. Lactation to f/u at a later time today.   RN reports that Mom has been sleeping all morning.  Matthias Hughs [facility] 12/26/2018, 8:56 AM

## 2018-12-26 NOTE — Lactation Note (Signed)
This note was copied from a baby's chart. Lactation Consultation Note  Patient Name: Lauren Hawkins [id number] Date: 12/26/2018 Reason for consult: Initial assessment;Preterm <34wks;1st time breastfeeding;Primapara;NICU baby   Attempted to visit with mom 3 times, the first one was in [facility] care in her room 117, the second one was in baby's room 310 in the NICU and the third one after RN Delsa Sale called to informed [place] that mom came back from lunch and she's back in the NICU.  Mom is going home today, and she had some questions regarding pumping. She'll be getting a DEBP though her insurance, LC recommenced a couple of good brands, mom understands the importance of pumping in order to prevent engorgement and protect her supply.   Reviewed breastmilk storage guidelines, pumping schedule, engorgement prevention and treatment, treatment/prevention for sore nipples. She also told LC that she's not getting 12 ml with hand expression (she's not pumping but mainly hand expressing) mom requested bigger storage containers, NICU RN brought them in the room.   LC also advised mom to take all the Medela DEBP pieces to use it whenever she comes to the NICU to visit her baby. Discussed sterilization of pump parts when at home and hand hygiene. Both parents very engaged during [facility] consultation, praised them for their efforts. Parents reported all questions and concerns were answered, they're both aware of [place] services and will call PRN.  Maternal Data    Feeding    Interventions Interventions: Breast feeding basics reviewed  Lactation Tools Discussed/Used     Consult Status Consult Status: Complete Date: 12/26/18 Follow-up type: Call as needed    [place] 12/26/2018, 1:36 PM

## 2018-12-29 ENCOUNTER — Encounter: Payer: Self-pay | Admitting: Advanced Practice Midwife

## 2019-01-13 ENCOUNTER — Encounter: Payer: 59 | Admitting: Obstetrics & Gynecology

## 2019-01-18 ENCOUNTER — Telehealth: Payer: Self-pay

## 2019-01-18 DIAGNOSIS — F32A Depression, unspecified: Secondary | ICD-10-CM

## 2019-01-18 DIAGNOSIS — F329 Major depressive disorder, single episode, unspecified: Secondary | ICD-10-CM

## 2019-01-18 NOTE — Telephone Encounter (Signed)
Fredrich Birks, nurse from [facility] care called stating pt scored an 18 on depression scree. Pt is overwhelmed and sad because her baby is in the NICU. Sharyn Lull states pt was given info on family services and [facility]. Referral for Behavior health was put in.  chiquita l wilson, CMA

## 2019-01-25 ENCOUNTER — Other Ambulatory Visit: Payer: Self-pay

## 2019-01-25 ENCOUNTER — Telehealth: Payer: Self-pay | Admitting: Family Medicine

## 2019-01-25 ENCOUNTER — Encounter: Payer: Self-pay | Admitting: Obstetrics & Gynecology

## 2019-01-25 ENCOUNTER — Ambulatory Visit (INDEPENDENT_AMBULATORY_CARE_PROVIDER_SITE_OTHER): Payer: 59 | Admitting: Obstetrics & Gynecology

## 2019-01-25 VITALS — BP 118/69 | HR 78 | Ht 64.0 in | Wt 239.0 lb

## 2019-01-25 DIAGNOSIS — F329 Major depressive disorder, single episode, unspecified: Secondary | ICD-10-CM

## 2019-01-25 DIAGNOSIS — F32A Depression, unspecified: Secondary | ICD-10-CM

## 2019-01-25 NOTE — Progress Notes (Signed)
Post Partum Exam  Lauren Hawkins is a 28 y.o. [id number] female who presents for a postpartum visit. She is 5 weeks postpartum following a spontaneous vaginal delivery;PPROM. I have fully reviewed the prenatal and intrapartum course. The delivery was at 28 gestational weeks.  Anesthesia: none. Postpartum course has been uneventful. Baby's course has been complicated by preterm delivery. Baby is feeding by breast. Bleeding no bleeding. Bowel function is normal. Bladder function is normal. Patient is not sexually active. Contraception method is none. Postpartum depression screening:POSITIVE (score20) -referral to [facility] placed patient has appointment 02-02-2019  Patient reports pap smear done in last year but not sure where. Patient made aware that one not done here at [facility] prenatal visit.   Review of Systems Pertinent items are noted in HPI.    Objective:  Last menstrual period 06/11/2018. BP 118/69   Pulse 78   Ht 5\' 4"  (1.626 m)   Wt 239 lb (108.4 kg)   LMP 06/11/2018 (Exact Date)   Breastfeeding Yes   BMI 41.02 kg/m   CONSTITUTIONAL: Well-developed, well-nourished female in no acute distress.  HENT:  Normocephalic, atraumatic EYES: Conjunctivae and EOM are normal. No scleral icterus.  NECK: Normal range of motion SKIN: Skin is warm and dry. No rash noted. Not diaphoretic.No pallor. [place]: Alert and oriented to person, place, and time. Normal coordination.   Assessment:    5 weeks postpartum exam.   Pt delivered at 28 weeks as a result of PPROM. Pt had questions re delivery preterm .   Reviewed all forms of birth control options available including hormonal contraceptive medication including pill, patch, ring, injection,contraceptive implant; hormonal and nonhormonal IUDs; permanent sterilization options including vasectomy and the various tubal sterilization modalities. Risks and benefits reviewed.  Questions were answered.  Information was given to patient to review.   Elevated  Edinburgh score. Pt declines meds     Plan:   1. Contraception: pt desires to have IUD in 1-2 weeks 2. All questions answered re delivery.  3. Follow up in: 1 week or as needed.  4. Referral to [place]. Harraway-Smith, M.D., Cherlynn June

## 2019-01-25 NOTE — BH Specialist Note (Signed)
Integrated Behavioral Health via Telemedicine Video Visit  01/25/2019 Juanito DoomJoyecia Brimley [id number]  Number of Integrated Behavioral Health visits: 1 Session Start time: 9:21  Session End time: 10:08 Total time: 45 minutes  Referring Provider: Willodean Rosenthalarolyn Harraway-Smith, MD Type of Visit: Video Patient/Family location: [facility] Provider location: [facility] All persons participating in visit: Patient Juanito DoomJoyecia Sardo and [facility] Jamie McMannes  Confirmed patient's address: Yes  Confirmed patient's phone number: Yes  Any changes to demographics: No   Confirmed patient's insurance: Yes  Any changes to patient's insurance: No   Discussed confidentiality: Yes   I connected with Tanishka Tober  by a video enabled telemedicine application and verified that I am speaking with the correct person using two identifiers.     I discussed the limitations of evaluation and management by telemedicine and the availability of in person appointments.  I discussed that the purpose of this visit is to provide behavioral health care while limiting exposure to the novel coronavirus.   Discussed there is a possibility of technology failure and discussed alternative modes of communication if that failure occurs.  I discussed that engaging in this video visit, they consent to the provision of behavioral healthcare and the services will be billed under their insurance.  Patient and/or legal guardian expressed understanding and consented to video visit: Yes   PRESENTING CONCERNS: Patient and/or family reports the following symptoms/concerns: Pt states her primary concern is having a difficult time adjusting to having baby in NICU, and feeling depressed with anxiety, worry, fatigue, low appetite, irritability and fear.  Duration of problem: Postpartum; Severity of problem: moderately severe  STRENGTHS (Protective Factors/Coping Skills): Supportive family  GOALS ADDRESSED: Patient will: 1.  Reduce symptoms of: anxiety,  depression and stress  2.  Increase knowledge and/or ability of: coping skills and healthy habits  3.  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   Patient may benefit from psychoeducation and brief therapeutic interventions regarding coping with symptoms of anxiety and depression.  PLAN: 1. Follow up with behavioral health clinician on : One week 2. Behavioral recommendations:  -Begin taking BH medication, as prescribed -Accept referral to psychiatry -Consider CALM relaxation breathing daily, as discussed 3. Referral(s): [facility] and Psychiatrist  I discussed the assessment and treatment plan with the patient and/or parent/guardian. They were provided an opportunity to ask questions and all were answered. They agreed with the plan and demonstrated an understanding of the instructions.   They were advised to call back or seek an in-person evaluation if the symptoms worsen or if the condition fails to improve as anticipated.  Valetta CloseJamie C McMannes  Depression screen [facility] 01/26/2019  Decreased Interest 2  Down, Depressed, Hopeless 2  PHQ - 2 Score 4  Altered sleeping 2  Tired, decreased energy 3  Change in appetite 2  Feeling bad or failure about yourself  1  Trouble concentrating 1  Moving slowly or fidgety/restless 1  Suicidal thoughts 0  PHQ-9 Score 14   GAD 7 : Generalized Anxiety Score 01/26/2019  Nervous, Anxious, on Edge 3  Control/stop worrying 3  Worry too much - different things 3  Trouble relaxing 1  Restless 1  Easily annoyed  or irritable 3  Afraid - awful might happen 3  Total GAD 7 Score 17

## 2019-01-25 NOTE — Telephone Encounter (Signed)
Attempted to contact patient about her appointment on 9/1 @ 9:15. Patient instructed that the appointment is a mychart visit. Patient instructed to download the mychart app if not already done so. Patient instructed to give the office a call with any concerns. Patient was sent the link to sign up for mychart prior to her appointment tomorrow.

## 2019-01-25 NOTE — Patient Instructions (Signed)
Levonorgestrel intrauterine device (IUD) What is this medicine? LEVONORGESTREL IUD (LEE voe nor jes trel) is a contraceptive (birth control) device. The device is placed inside the uterus by a healthcare professional. It is used to prevent pregnancy. This device can also be used to treat heavy bleeding that occurs during your period. This medicine may be used for other purposes; ask your health care provider or pharmacist if you have questions. COMMON BRAND NAME(S): Kyleena, LILETTA, Mirena, Skyla What should I tell my health care provider before I take this medicine? They need to know if you have any of these conditions:  abnormal Pap smear  cancer of the breast, uterus, or cervix  diabetes  endometritis  genital or pelvic infection now or in the past  have more than one sexual partner or your partner has more than one partner  heart disease  history of an ectopic or tubal pregnancy  immune system problems  IUD in place  liver disease or tumor  problems with blood clots or take blood-thinners  seizures  use intravenous drugs  uterus of unusual shape  vaginal bleeding that has not been explained  an unusual or allergic reaction to levonorgestrel, other hormones, silicone, or polyethylene, medicines, foods, dyes, or preservatives  pregnant or trying to get pregnant  breast-feeding How should I use this medicine? This device is placed inside the uterus by a health care professional. Talk to your pediatrician regarding the use of this medicine in children. Special care may be needed. Overdosage: If you think you have taken too much of this medicine contact a poison control center or emergency room at once. NOTE: This medicine is only for you. Do not share this medicine with others. What if I miss a dose? This does not apply. Depending on the brand of device you have inserted, the device will need to be replaced every 3 to 6 years if you wish to continue using this type  of birth control. What may interact with this medicine? Do not take this medicine with any of the following medications:  amprenavir  bosentan  fosamprenavir This medicine may also interact with the following medications:  aprepitant  armodafinil  barbiturate medicines for inducing sleep or treating seizures  bexarotene  boceprevir  griseofulvin  medicines to treat seizures like carbamazepine, ethotoin, felbamate, oxcarbazepine, phenytoin, topiramate  modafinil  pioglitazone  rifabutin  rifampin  rifapentine  some medicines to treat HIV infection like atazanavir, efavirenz, indinavir, lopinavir, nelfinavir, tipranavir, ritonavir  St. John's wort  warfarin This list may not describe all possible interactions. Give your health care provider a list of all the medicines, herbs, non-prescription drugs, or dietary supplements you use. Also tell them if you smoke, drink alcohol, or use illegal drugs. Some items may interact with your medicine. What should I watch for while using this medicine? Visit your doctor or health care professional for regular check ups. See your doctor if you or your partner has sexual contact with others, becomes HIV positive, or gets a sexual transmitted disease. This product does not protect you against HIV infection (AIDS) or other sexually transmitted diseases. You can check the placement of the IUD yourself by reaching up to the top of your vagina with clean fingers to feel the threads. Do not pull on the threads. It is a good habit to check placement after each menstrual period. Call your doctor right away if you feel more of the IUD than just the threads or if you cannot feel the threads at   all. The IUD may come out by itself. You may become pregnant if the device comes out. If you notice that the IUD has come out use a backup birth control method like condoms and call your health care provider. Using tampons will not change the position of the  IUD and are okay to use during your period. This IUD can be safely scanned with magnetic resonance imaging (MRI) only under specific conditions. Before you have an MRI, tell your healthcare provider that you have an IUD in place, and which type of IUD you have in place. What side effects may I notice from receiving this medicine? Side effects that you should report to your doctor or health care professional as soon as possible:  allergic reactions like skin rash, itching or hives, swelling of the face, lips, or tongue  fever, flu-like symptoms  genital sores  high blood pressure  no menstrual period for 6 weeks during use  pain, swelling, warmth in the leg  pelvic pain or tenderness  severe or sudden headache  signs of pregnancy  stomach cramping  sudden shortness of breath  trouble with balance, talking, or walking  unusual vaginal bleeding, discharge  yellowing of the eyes or skin Side effects that usually do not require medical attention (report to your doctor or health care professional if they continue or are bothersome):  acne  breast pain  change in sex drive or performance  changes in weight  cramping, dizziness, or faintness while the device is being inserted  headache  irregular menstrual bleeding within first 3 to 6 months of use  nausea This list may not describe all possible side effects. Call your doctor for medical advice about side effects. You may report side effects to FDA at 1-800-FDA-1088. Where should I keep my medicine? This does not apply. NOTE: This sheet is a summary. It may not cover all possible information. If you have questions about this medicine, talk to your doctor, pharmacist, or health care provider.  2020 Elsevier/Gold Standard (2018-03-24 13:22:01)  

## 2019-01-26 ENCOUNTER — Telehealth (INDEPENDENT_AMBULATORY_CARE_PROVIDER_SITE_OTHER): Payer: 59 | Admitting: Clinical

## 2019-01-26 DIAGNOSIS — F4323 Adjustment disorder with mixed anxiety and depressed mood: Secondary | ICD-10-CM

## 2019-01-27 ENCOUNTER — Other Ambulatory Visit: Payer: Self-pay | Admitting: Obstetrics & Gynecology

## 2019-01-27 DIAGNOSIS — F53 Postpartum depression: Secondary | ICD-10-CM

## 2019-01-27 MED ORDER — SERTRALINE HCL 50 MG PO TABS: 50.0000 mg | ORAL_TABLET | Freq: Every day | ORAL | 3 refills | Status: DC

## 2019-01-28 ENCOUNTER — Telehealth: Payer: Self-pay | Admitting: Clinical

## 2019-01-28 NOTE — Telephone Encounter (Signed)
Integrated Behavioral Health Medication Management Phone Note  MRN: [id number] NAME: Lauren Hawkins  Time Call Initiated: 11:10 Time Call Completed: 11:13 Total Call Time: 15 minutes  Current Medications:  Outpatient Medications Prior to Visit  Medication Sig Dispense Refill  . acetaminophen (TYLENOL) 325 MG tablet Take 2 tablets (650 mg total) by mouth every 4 (four) hours as needed (for pain scale < 4). 30 tablet   . ibuprofen (ADVIL) 600 MG tablet Take 1 tablet (600 mg total) by mouth every 6 (six) hours as needed. 30 tablet 1  . Prenatal Vit-Fe Fumarate-FA (PRENATAL MULTIVITAMIN) TABS tablet Take 1 tablet by mouth daily at 12 noon. 30 tablet 3  . sertraline (ZOLOFT) 50 MG tablet Take 1 tablet (50 mg total) by mouth daily. 30 tablet 3   No facility-administered medications prior to visit.     Patient has been able to get all medications filled as prescribed: No: Pt says she will pick up medication today  Patient is currently taking all medications as prescribed: No: Has not picked up or begun taking yet  Patient reports experiencing side effects: n/a  Patient describes feeling this way on medications: n/a  Additional patient concerns: n/a  Patient advised to schedule appointment with provider for evaluation of medication side effects or additional concerns: Yes- Pt aware of visit with [facility] on 02/02/2019   Garlan Andreasen, LCSW

## 2019-01-29 NOTE — BH Specialist Note (Signed)
Pt called to cancel appointment today. Pt has upcoming appointment with psychiatry on 02/15/2019.   [place] via Telemedicine Video Visit  01/29/2019 Clemmie Buelna [phone]  Garlan Fragoso

## 2019-02-02 ENCOUNTER — Telehealth: Payer: 59

## 2019-02-02 ENCOUNTER — Telehealth: Payer: 59 | Admitting: Clinical

## 2019-02-02 ENCOUNTER — Telehealth: Payer: Self-pay | Admitting: Obstetrics & Gynecology

## 2019-02-02 ENCOUNTER — Other Ambulatory Visit: Payer: Self-pay

## 2019-02-02 NOTE — Telephone Encounter (Signed)
Patient called to say she was not able to keep her appointment today. She will call back at a later time and get rescheduled.

## 2019-02-10 ENCOUNTER — Ambulatory Visit: Payer: 59 | Admitting: Obstetrics & Gynecology

## 2019-02-13 ENCOUNTER — Ambulatory Visit: Payer: Self-pay

## 2019-02-13 NOTE — Lactation Note (Signed)
This note was copied from a baby's chart. Lactation Consultation Note  Patient Name: Lauren Hawkins [id number] Date: 02/13/2019 Reason for consult: Follow-up assessment;Mother's request;Primapara;1st time breastfeeding;NICU baby;Infant < 6lbs  Visited with mom of a 22 weeks old preterm < 6 lbs NICU female who just started his 72 hours of protected BF window. Mom requested a feeding assist, she's a P1 and has been pumping 4-6 times/24 hours and getting about 3-4 oz combined per pumping session. Stressed to mom the importance of pumping at least 8 times/24 hours in order to protect her supply, she voiced understanding.  LC tried latching baby to bare breast first, but baby would not latch he was very snorty due to reflux. He kept pushing away from the breast and even if he got the nipple into his mouth for a few seconds, he's loose the grasp due to mom's flat nipples. LC tried a NS # 20 next, and this time baby was able to latch but required constant stimulation to sustain the latch. Audible swallows were hard to hear due to heavy congestion but NS had EBM at the end of the feeding and baby kept sucking on and off during the 9 minutes feeding. Mom very excited for finally having her baby to breast, praised her for her efforts.  Reviewed pumping schedule, benefits of premature milk and NS placement. Mom able to do teach back to [facility] showing placement of NS when doing either cross cradle or football position. Mom aware to call for feeding assist when needed preferably with 24 hours notice. Mom also asked LC to show her how to burp baby, LC did and baby burped twice.  Feeding plan:  1. Encouraged mom to put baby to breast STS on feeding cues and/or for scheduled gavage feedings using NS # 20 2. She'll try to pump at least 8 times in 24 hours 3. She'll do bilateral pumping instead of using her [place] pump  Mom reported all questions and concerns were answered, she's aware of [place] OP services and will call  PRN.  Maternal Data    Feeding Feeding Type: Breast Fed  LATCH Score Latch: Repeated attempts needed to sustain latch, nipple held in mouth throughout feeding, stimulation needed to elicit sucking reflex.(with NS # 20)  Audible Swallowing: A few with stimulation(hard to hear, baby was very congested, but EBM present all over NS)  Type of Nipple: Flat  Comfort (Breast/Nipple): Soft / non-tender  Hold (Positioning): Assistance needed to correctly position infant at breast and maintain latch.  LATCH Score: 6  Interventions Interventions: Breast feeding basics reviewed;Assisted with latch;Breast massage;Hand express;Breast compression;DEBP;Adjust position;Support pillows;Position options  Lactation Tools Discussed/Used Tools: Pump;Nipple Shields Nipple shield size: 20 Breast pump type: Double-Electric Breast Pump   Consult Status Consult Status: Follow-up Date: 02/14/19 Follow-up type: In-patient    Zaiya Annunziato Francene Boyers 02/13/2019, 7:12 PM

## 2019-02-15 ENCOUNTER — Ambulatory Visit (HOSPITAL_COMMUNITY): Payer: 59 | Admitting: Licensed Clinical Social Worker

## 2019-02-15 ENCOUNTER — Other Ambulatory Visit: Payer: Self-pay

## 2019-02-15 ENCOUNTER — Telehealth (HOSPITAL_COMMUNITY): Payer: Self-pay | Admitting: Licensed Clinical Social Worker

## 2019-02-15 NOTE — Telephone Encounter (Signed)
Lauren Hawkins called at 2:09 pm before her 3:30pm appointment to cancel. Clinician offered to reschedule the appointment, but Lauren Hawkins reported that she was not interested in rescheduling at this time.   No Show.

## 2019-02-18 ENCOUNTER — Ambulatory Visit: Payer: Self-pay

## 2019-02-18 NOTE — Lactation Note (Signed)
This note was copied from a baby's chart. Lactation Consultation Note:  NICU phoned and scheduled a follow up [place] visit with mother at 12:00.  Infant is 49 weeks old. Mother had infant in football hold when I arrived in the room. Observed infant with a shallow latch using a #20 NS. Unable to flange lips. Switched to a #24. Infant latched well with assistance in flanging his lips for wider gape. Infant sustained latch for 10 mins with steady pattern of suckling and audible swallows.  Observed large ebm in the tip of the nipple shield.   Attempt to latch infant on the alternate breast but infant was satisfied.  Mother to given infant ebm with bottle. Infant is then being gavage fed the remainder of his feeding.   Mother reports that she breastfeeds infant at least 3 times while in the NICU.  Mother reports that she has been pumping up to 6 times in 24 hours and she is obtaining 2-3 ounces pre breast with each pumping.  Encouraged mother to increase pumping once more daily.  Encouraged frequent STS.   Mother to schedule another [place] visit piror to discharge.     Patient Name: Lauren Hawkins [id number]'Y Date: 02/18/2019     Maternal Data    Feeding Feeding Type: Breast Milk  LATCH Score Latch: Grasps breast easily, tongue down, lips flanged, rhythmical sucking.  Audible Swallowing: Spontaneous and intermittent  Type of Nipple: Flat  Comfort (Breast/Nipple): Soft / non-tender  Hold (Positioning): Assistance needed to correctly position infant at breast and maintain latch.  LATCH Score: 8  Interventions    Lactation Tools Discussed/Used     Consult Status      Darla Lesches 02/18/2019, 4:21 PM

## 2019-03-06 ENCOUNTER — Ambulatory Visit: Payer: Self-pay

## 2019-03-06 NOTE — Lactation Note (Addendum)
This note was copied from a baby's chart. Lactation Consultation Note  Patient Name: Lauren Hawkins [id number] Date: 03/06/2019  Attempt to see mom.  Mom not here.  Urged her to call lactation as needed.  Maternal Data    Feeding Feeding Type: Bottle Fed - Breast Milk Nipple Type: Dr. Clement Husbands  [facility] Score                   Interventions    Lactation Tools Discussed/Used     Consult Status      Lauren Hawkins 03/06/2019, 8:13 PM

## 2020-05-27 NOTE — L&D Delivery Note (Signed)
[facility]N [facility] Delivery Note  Lauren Hawkins is a 30 y.o. [id number] s/p vag del at [redacted]w[redacted]d. She was admitted for latent labor.   ROM: 0h 20m with clear fluid GBS Status: neg Maximum Maternal Temperature: 99.0  Labor Progress: Lauren Hawkins was admitted from the office where she was noted to be contracting and 5cm dilated. She progressed spont to complete after epidural placement, and then had AROM of a bulging bag once she was complete.  Delivery Date/Time: August 25th, 2022 at 0316 Delivery: Called to room and patient was complete and pushing. Head delivered ROA. Nuchal cord present x 1, reduced prior to delivery. Shoulder and body delivered in usual fashion. Infant with spontaneous cry, placed on mother's abdomen, dried and stimulated. Cord clamped x 2 after 1-minute delay, and cut by FOB. Cord blood drawn. Placenta delivered spontaneously with gentle cord traction. Fundus firm with massage and Pitocin. Labia, perineum, vagina, and cervix inspected inspected and found to have a 1st deg vag lac repaired with 3-0 Vicryl in the usual fashion.   Placenta: spont, intact; to L&D Complications: none Lacerations: 1st deg vag EBL: 100cc Analgesia: epidural  Postpartum Planning [x]  message to sent to schedule follow-up   Infant: boy  APGARs 8/9  3204g (7lb 1oz)  10/9, CNM  01/18/2021 3:49 AM

## 2020-07-12 ENCOUNTER — Other Ambulatory Visit: Payer: Self-pay

## 2020-07-12 ENCOUNTER — Ambulatory Visit (INDEPENDENT_AMBULATORY_CARE_PROVIDER_SITE_OTHER): Payer: No Typology Code available for payment source | Admitting: Obstetrics and Gynecology

## 2020-07-12 ENCOUNTER — Encounter: Payer: Self-pay | Admitting: Obstetrics and Gynecology

## 2020-07-12 ENCOUNTER — Other Ambulatory Visit (HOSPITAL_COMMUNITY)
Admission: RE | Admit: 2020-07-12 | Discharge: 2020-07-12 | Disposition: A | Discharge status: Home / Self Care | Payer: No Typology Code available for payment source | Source: Ambulatory Visit | Attending: Obstetrics and Gynecology | Admitting: Obstetrics and Gynecology

## 2020-07-12 VITALS — BP 113/71 | HR 100 | Wt 223.0 lb

## 2020-07-12 DIAGNOSIS — O09211 Supervision of pregnancy with history of pre-term labor, first trimester: Secondary | ICD-10-CM | POA: Diagnosis not present

## 2020-07-12 DIAGNOSIS — Z3480 Encounter for supervision of other normal pregnancy, unspecified trimester: Secondary | ICD-10-CM | POA: Insufficient documentation

## 2020-07-12 DIAGNOSIS — O09891 Supervision of other high risk pregnancies, first trimester: Secondary | ICD-10-CM | POA: Diagnosis not present

## 2020-07-12 DIAGNOSIS — Z7189 Other specified counseling: Secondary | ICD-10-CM

## 2020-07-12 DIAGNOSIS — L509 Urticaria, unspecified: Secondary | ICD-10-CM

## 2020-07-12 DIAGNOSIS — O09899 Supervision of other high risk pregnancies, unspecified trimester: Secondary | ICD-10-CM | POA: Insufficient documentation

## 2020-07-12 DIAGNOSIS — B379 Candidiasis, unspecified: Secondary | ICD-10-CM | POA: Diagnosis not present

## 2020-07-12 DIAGNOSIS — N760 Acute vaginitis: Secondary | ICD-10-CM | POA: Diagnosis not present

## 2020-07-12 DIAGNOSIS — O99711 Diseases of the skin and subcutaneous tissue complicating pregnancy, first trimester: Secondary | ICD-10-CM

## 2020-07-12 DIAGNOSIS — Z3A11 11 weeks gestation of pregnancy: Secondary | ICD-10-CM | POA: Diagnosis not present

## 2020-07-12 DIAGNOSIS — B9689 Other specified bacterial agents as the cause of diseases classified elsewhere: Secondary | ICD-10-CM

## 2020-07-12 DIAGNOSIS — O99891 Other specified diseases and conditions complicating pregnancy: Secondary | ICD-10-CM

## 2020-07-12 DIAGNOSIS — R87612 Low grade squamous intraepithelial lesion on cytologic smear of cervix (LGSIL): Secondary | ICD-10-CM

## 2020-07-12 DIAGNOSIS — Z8759 Personal history of other complications of pregnancy, childbirth and the puerperium: Secondary | ICD-10-CM

## 2020-07-12 DIAGNOSIS — Z6836 Body mass index (BMI) 36.0-36.9, adult: Secondary | ICD-10-CM

## 2020-07-12 MED ORDER — ASPIRIN EC 81 MG PO TBEC: 81.0000 mg | DELAYED_RELEASE_TABLET | Freq: Every day | ORAL | 2 refills | Status: DC

## 2020-07-12 MED ORDER — DOXYLAMINE-PYRIDOXINE 10-10 MG PO TBEC: 2.0000 | DELAYED_RELEASE_TABLET | Freq: Every day | ORAL | 5 refills | Status: DC

## 2020-07-12 NOTE — Progress Notes (Addendum)
INITIAL PRENATAL VISIT NOTE  Subjective:  Lauren Hawkins is a 30 y.o. G3P0010 at [redacted]w[redacted]d by LMP c/w 1st trim [place] being seen today for her initial prenatal visit. She has an obstetric history significant for PPROM with SVD at 28 weeks, chorio on path. She has a medical history significant for n/a.  Patient reports nausea.  Contractions: Not present. Vag. Bleeding: None.  Movement: Absent. Denies leaking of fluid. Had hives in the fall, no known cause.   Past Medical History:  Diagnosis Date  . Medical history non-contributory     Past Surgical History:  Procedure Laterality Date  . INDUCED ABORTION      OB History  Gravida Para Term Preterm AB Living  3       1 0  SAB IAB Ectopic Multiple Live Births    1     0    # Outcome Date GA Lbr Len/2nd Weight Sex Delivery Anes PTL Lv  3 Current           2 IAB           1 Gravida             Social History   Socioeconomic History  . Marital status: Single    Spouse name: Not on file  . Number of children: Not on file  . Years of education: Not on file  . Highest education level: Not on file  Occupational History  . Not on file  Tobacco Use  . Smoking status: Never Smoker  . Smokeless tobacco: Never Used  Substance and Sexual Activity  . Alcohol use: Not Currently  . Drug use: Not Currently  . Sexual activity: Yes    Birth control/protection: None  Other Topics Concern  . Not on file  Social History Narrative  . Not on file   Social Determinants of Health   Financial Resource Strain: Not on file  Food Insecurity: Not on file  Transportation Needs: Not on file  Physical Activity: Not on file  Stress: Not on file  Social Connections: Not on file    Family History  Problem Relation Age of Onset  . Hypertension Mother      Current Outpatient Medications:  .  aspirin EC 81 MG tablet, Take 1 tablet (81 mg total) by mouth daily. Take after 12 weeks for prevention of preeclampsia later in pregnancy, Disp: 300 tablet,  Rfl: 2 .  Doxylamine-Pyridoxine (DICLEGIS) 10-10 MG TBEC, Take 2 tablets by mouth at bedtime. If symptoms persist, add one tablet in the morning and one in the afternoon, Disp: 100 tablet, Rfl: 5 .  Prenatal Vit-Fe Fumarate-FA (PRENATAL MULTIVITAMIN) TABS tablet, Take 1 tablet by mouth daily at 12 noon., Disp: 30 tablet, Rfl: 3 .  acetaminophen (TYLENOL) 325 MG tablet, Take 2 tablets (650 mg total) by mouth every 4 (four) hours as needed (for pain scale < 4). (Patient not taking: Reported on 07/12/2020), Disp: 30 tablet, Rfl:  .  ibuprofen (ADVIL) 600 MG tablet, Take 1 tablet (600 mg total) by mouth every 6 (six) hours as needed. (Patient not taking: Reported on 07/12/2020), Disp: 30 tablet, Rfl: 1 .  sertraline (ZOLOFT) 50 MG tablet, Take 1 tablet (50 mg total) by mouth daily. (Patient not taking: Reported on 07/12/2020), Disp: 30 tablet, Rfl: 3  No Known Allergies  Review of Systems: Negative except for what is mentioned in HPI.  Objective:   Vitals:   07/12/20 1420  BP: 113/71  Pulse: 100  Weight:  223 lb (101.2 kg)    Fetal Status: Fetal Heart Rate (bpm): 160   Movement: Absent     Physical Exam: BP 113/71   Pulse 100   Wt 223 lb (101.2 kg)   LMP 04/25/2020   BMI 38.28 kg/m  CONSTITUTIONAL: Well-developed, well-nourished female in no acute distress.  NEUROLOGIC: Alert and oriented to person, place, and time. Normal reflexes, muscle tone coordination. No cranial nerve deficit noted. PSYCHIATRIC: Normal mood and affect. Normal behavior. Normal judgment and thought content. SKIN: Skin is warm and dry. No rash noted. Not diaphoretic. No erythema. No pallor. HENT:  Normocephalic, atraumatic, External right and left ear normal. Oropharynx is clear and moist EYES: Conjunctivae and EOM are normal. Pupils are equal, round, and reactive to light. No scleral icterus.  NECK: Normal range of motion, supple, no masses CARDIOVASCULAR: Normal heart rate noted, regular rhythm RESPIRATORY: Effort  and breath sounds normal, no problems with respiration noted BREASTS: symmetric, non-tender, no masses palpable ABDOMEN: Soft, nontender, nondistended, gravid. GU: normal appearing external female genitalia, multiparous normal appearing cervix, scant white discharge in vagina, no lesions noted Bimanual: 11 weeks sized uterus, no adnexal tenderness or palpable lesions noted MUSCULOSKELETAL: Normal range of motion. EXT:  No edema and no tenderness. 2+ distal pulses.   Assessment and Plan:  Pregnancy: G3P0010 at [redacted]w[redacted]d by LMP  1. H/O preterm delivery, currently pregnant Recommend starting Makena 16 weeks - US OB Transvaginal; Future  2. Supervision of other normal pregnancy, antepartum Reviewed [facility] for [employer] structure, multiple providers, fellows, medical students, virtual visits, MyChart.  - Cytology - PAP( [place]) - CBC/D/Plt+RPR+Rh+ABO+Rub Ab... - Culture, OB Urine - [place] MFM OB COMP + 14 WK; Future - Hemoglobin A1c - CHL AMB BABYSCRIPTS OPT IN - diclegis sent for nausea  3. BMI 36.0-36.9,adult - Reviewed risks of preeclampsia - Start baby ASA 12 weeks - TSH  4. Counseled about COVID-19 virus infection  COVID-19 Vaccine Counseling: - The patient was counseled on the potential benefits and lack of known risks of COVID vaccination, during pregnancy and breastfeeding, during today's visit. The patient's questions and concerns were addressed today, including safety of the vaccination and potential side effects as they have been published by ACOG and [facility]. The patient has been informed that there have not been any documented vaccine related injuries, deaths or birth defects to infant or mom after receiving the COVID-19 vaccine to date. The patient has been made aware that although she is not at increased risk of contracting COVID-19 during pregnancy, she is at increased risk of developing severe disease and complications if she contracts COVID-19 while pregnant.  All patient questions were addressed during our visit today. The patient is still unsure of her decision for vaccination.  - pt had mild COVId 05/2020 - with new guidance regarding vaccination in 3rd trimester and protective effects for baby, will wait for now and see if MFM/sMFM/ACOG updates guidelines regarding vaccination  5. Hives - likely contact allergy of unknown source  Preterm labor symptoms and general obstetric precautions including but not limited to vaginal bleeding, contractions, leaking of fluid and fetal movement were reviewed in detail with the patient. Please refer to After Visit Summary for other counseling recommendations.   Return in about 4 weeks (around 08/09/2020) for high OB, in person.  Future Appointments  Date Time Provider Department Center  07/24/2020  3:45 PM [facility] US1 [facility]  08/09/2020  4:00 PM Reva Bores, MD [facility] None  09/05/2020  1:45  PM [facility] US5 [facility]    Conan Bowens, MD    Preterm labor symptoms and general obstetric precautions including but not limited to vaginal bleeding, contractions, leaking of fluid and fetal movement were reviewed in detail with the patient.  Please refer to After Visit Summary for other counseling recommendations.   Return in about 4 weeks (around 08/09/2020) for high OB, in person.  Conan Bowens 07/12/2020 4:03 PM

## 2020-07-12 NOTE — Progress Notes (Signed)
DATING AND VIABILITY SONOGRAM   Lauren Hawkins is a 30 y.o. year old G3P0010 with LMP Patient's last menstrual period was 04/25/2020. which would correlate to  [redacted]w[redacted]d weeks gestation.  She has regular menstrual cycles.   She is here today for a confirmatory initial sonogram.    GESTATION: SINGLETON     FETAL ACTIVITY:          Heart rate       160 bpm          The fetus is active.    GESTATIONAL AGE AND  BIOMETRICS:  Gestational criteria: Estimated Date of Delivery: 01/30/21 by LMP now at [redacted]w[redacted]d  Previous Scans:0      CROWN RUMP LENGTH           4.36cm         11.1 weeks                                                                               AVERAGE EGA(BY THIS SCAN):  11-1 weeks  WORKING EDD( early ultrasound ):  01-30-2021     TECHNICIAN COMMENTS:  Patient informed that the ultrasound is considered a limited obstetric ultrasound and is not intended to be a complete ultrasound exam. Patient also informed that the ultrasound is not being completed with the intent of assessing for fetal or placental anomalies or any pelvic abnormalities. Explained that the purpose of today's ultrasound is to assess for fetal heart rate. Patient acknowledges the purpose of the exam and the limitations of the study.     Armandina Stammer 07/12/2020 4:12 PM

## 2020-07-13 LAB — HEMOGLOBIN A1C
Est. average glucose Bld gHb Est-mCnc: 105 mg/dL
Hgb A1c MFr Bld: 5.3 % (ref 4.8–5.6)

## 2020-07-13 LAB — CBC/D/PLT+RPR+RH+ABO+RUB AB...
Antibody Screen: NEGATIVE
Basophils Absolute: 0 10*3/uL (ref 0.0–0.2)
Basos: 0 %
EOS (ABSOLUTE): 0.1 10*3/uL (ref 0.0–0.4)
Eos: 1 %
HCV Ab: <0.1 s/co ratio (ref 0.0–0.9)
HIV Screen 4th Generation wRfx: NONREACTIVE
Hematocrit: 37.1 % (ref 34.0–46.6)
Hemoglobin: 12.3 g/dL (ref 11.1–15.9)
Hepatitis B Surface Ag: NEGATIVE
Immature Grans (Abs): 0 10*3/uL (ref 0.0–0.1)
Immature Granulocytes: 0 %
Lymphocytes Absolute: 2 10*3/uL (ref 0.7–3.1)
Lymphs: 23 %
MCH: 29.6 pg (ref 26.6–33.0)
MCHC: 33.2 g/dL (ref 31.5–35.7)
MCV: 89 fL (ref 79–97)
Monocytes Absolute: 0.5 10*3/uL (ref 0.1–0.9)
Monocytes: 6 %
Neutrophils Absolute: 6.1 10*3/uL (ref 1.4–7.0)
Neutrophils: 70 %
Platelets: 258 10*3/uL (ref 150–450)
RBC: 4.16 x10E6/uL (ref 3.77–5.28)
RDW: 12.7 % (ref 11.7–15.4)
RPR Ser Ql: NONREACTIVE
Rh Factor: POSITIVE
Rubella Antibodies, IGG: 4.46 index (ref >0.99)
WBC: 8.7 10*3/uL (ref 3.4–10.8)

## 2020-07-13 LAB — TSH: TSH: 1.8 u[IU]/mL (ref 0.450–4.500)

## 2020-07-13 LAB — HCV INTERPRETATION

## 2020-07-14 LAB — URINE CULTURE, OB REFLEX

## 2020-07-14 LAB — CULTURE, OB URINE

## 2020-07-17 ENCOUNTER — Other Ambulatory Visit: Payer: Self-pay | Admitting: Obstetrics and Gynecology

## 2020-07-17 DIAGNOSIS — O09899 Supervision of other high risk pregnancies, unspecified trimester: Secondary | ICD-10-CM

## 2020-07-17 LAB — CYTOLOGY - PAP
Chlamydia: NEGATIVE
Chlamydia: NEGATIVE
Comment: NEGATIVE
Comment: NEGATIVE
Comment: NORMAL
Comment: NORMAL
Neisseria Gonorrhea: NEGATIVE
Neisseria Gonorrhea: NEGATIVE

## 2020-07-20 ENCOUNTER — Encounter: Payer: Self-pay | Admitting: Obstetrics and Gynecology

## 2020-07-20 DIAGNOSIS — R87612 Low grade squamous intraepithelial lesion on cytologic smear of cervix (LGSIL): Secondary | ICD-10-CM | POA: Insufficient documentation

## 2020-07-20 MED ORDER — CLOTRIMAZOLE 1 % VA CREA: 1.0000 | TOPICAL_CREAM | Freq: Every day | VAGINAL | 2 refills | Status: DC

## 2020-07-20 MED ORDER — METRONIDAZOLE 500 MG PO TABS: 500.0000 mg | ORAL_TABLET | Freq: Two times a day (BID) | ORAL | 0 refills | Status: AC

## 2020-07-20 NOTE — Addendum Note (Signed)
Addended by: Leroy Libman on: 07/20/2020 01:55 PM   Modules accepted: Orders

## 2020-07-24 ENCOUNTER — Ambulatory Visit: Admission: RE | Admit: 2020-07-24 | Payer: No Typology Code available for payment source | Source: Ambulatory Visit

## 2020-08-09 ENCOUNTER — Other Ambulatory Visit: Payer: Self-pay

## 2020-08-09 ENCOUNTER — Ambulatory Visit (INDEPENDENT_AMBULATORY_CARE_PROVIDER_SITE_OTHER): Payer: No Typology Code available for payment source | Admitting: Family Medicine

## 2020-08-09 ENCOUNTER — Encounter: Payer: Self-pay | Admitting: Family Medicine

## 2020-08-09 VITALS — BP 126/57 | HR 79 | Wt 225.0 lb

## 2020-08-09 DIAGNOSIS — N760 Acute vaginitis: Secondary | ICD-10-CM

## 2020-08-09 DIAGNOSIS — R87612 Low grade squamous intraepithelial lesion on cytologic smear of cervix (LGSIL): Secondary | ICD-10-CM

## 2020-08-09 DIAGNOSIS — Z3A15 15 weeks gestation of pregnancy: Secondary | ICD-10-CM

## 2020-08-09 DIAGNOSIS — O09899 Supervision of other high risk pregnancies, unspecified trimester: Secondary | ICD-10-CM

## 2020-08-09 DIAGNOSIS — Z3480 Encounter for supervision of other normal pregnancy, unspecified trimester: Secondary | ICD-10-CM

## 2020-08-09 DIAGNOSIS — B9689 Other specified bacterial agents as the cause of diseases classified elsewhere: Secondary | ICD-10-CM

## 2020-08-09 MED ORDER — METRONIDAZOLE 500 MG PO TABS: 500.0000 mg | ORAL_TABLET | Freq: Two times a day (BID) | ORAL | 0 refills | Status: DC

## 2020-08-09 MED ORDER — MAKENA 275 MG/1.1ML ~~LOC~~ SOAJ: 275.0000 mg | SUBCUTANEOUS | 5 refills | Status: DC

## 2020-08-09 NOTE — Patient Instructions (Signed)

## 2020-08-09 NOTE — Progress Notes (Signed)
   PRENATAL VISIT NOTE  Subjective:  Lauren Hawkins is a 30 y.o. G3P0110 at [redacted]w[redacted]d being seen today for ongoing prenatal care.  She is currently monitored for the following issues for this high-risk pregnancy and has Sciatica of left side; S/P cervical polypectomy; ASCUS with positive high risk HPV cervical; H/O preterm delivery, currently pregnant; Supervision of other normal pregnancy, antepartum; and LGSIL on Pap smear of cervix on their problem list.  Patient reports no complaints.  Contractions: Not present. Vag. Bleeding: None.  Movement: Absent. Denies leaking of fluid.   The following portions of the patient's history were reviewed and updated as appropriate: allergies, current medications, past family history, past medical history, past social history, past surgical history and problem list.   Objective:   Vitals:   08/09/20 1617  BP: (!) 126/57  Pulse: 79  Weight: 225 lb (102.1 kg)    Fetal Status: Fetal Heart Rate (bpm): 152   Movement: Absent     General:  Alert, oriented and cooperative. Patient is in no acute distress.  Skin: Skin is warm and dry. No rash noted.   Cardiovascular: Normal heart rate noted  Respiratory: Normal respiratory effort, no problems with respiration noted  Abdomen: Soft, gravid, appropriate for gestational age.  Pain/Pressure: Absent     Pelvic: Cervical exam deferred        Extremities: Normal range of motion.  Edema: None  Mental Status: Normal mood and affect. Normal behavior. Normal judgment and thought content.   Assessment and Plan:  Pregnancy: G3P0110 at [redacted]w[redacted]d 1. Supervision of other normal pregnancy, antepartum AFP and Panorama today - AFP, Serum, Open Spina Bifida - Genetic Screening  2. [redacted] weeks gestation of pregnancy   3. H/O preterm delivery, currently pregnant PPROM with neg amnisure and subsequent PTB within 32 hours--path shows chorio R/B of Makena discussed at length--shared decision making. She opts for  Makena--ordered.  4. Bacterial vaginosis Seen on pap--rx sent in - metroNIDAZOLE (FLAGYL) 500 MG tablet; Take 1 tablet (500 mg total) by mouth 2 (two) times daily.  Dispense: 14 tablet; Refill: 0  5. LGSIL on Pap smear of cervix Repeat pp per KMD  General obstetric precautions including but not limited to vaginal bleeding, contractions, leaking of fluid and fetal movement were reviewed in detail with the patient. Please refer to After Visit Summary for other counseling recommendations.   Return in 4 weeks (on 09/06/2020).  Future Appointments  Date Time Provider Department Center  08/15/2020  3:30 PM [facility] NURSE [facility]  08/15/2020  3:45 PM [facility] US5 [facility]  08/16/2020  4:00 PM [facility] NURSE [facility] None  08/23/2020  4:00 PM [facility] NURSE [facility] None  08/30/2020  4:00 PM [facility] NURSE [facility] None  09/05/2020  1:45 PM [facility] US5 [facility]    Reva Bores, MD

## 2020-08-14 ENCOUNTER — Telehealth: Payer: Self-pay

## 2020-08-14 NOTE — Telephone Encounter (Signed)
A prior authorization was initiated with [facility] for [facility] auto injectors on 08/14/2020. Hadiya Spoerl l Steve Gregg, CMA

## 2020-08-15 ENCOUNTER — Telehealth: Payer: Self-pay

## 2020-08-15 ENCOUNTER — Encounter: Payer: Self-pay | Admitting: *Deleted

## 2020-08-15 ENCOUNTER — Ambulatory Visit: Payer: No Typology Code available for payment source | Admitting: *Deleted

## 2020-08-15 ENCOUNTER — Ambulatory Visit: Payer: No Typology Code available for payment source | Attending: Obstetrics and Gynecology

## 2020-08-15 ENCOUNTER — Other Ambulatory Visit: Payer: Self-pay

## 2020-08-15 DIAGNOSIS — O09899 Supervision of other high risk pregnancies, unspecified trimester: Secondary | ICD-10-CM | POA: Diagnosis not present

## 2020-08-15 DIAGNOSIS — Z3480 Encounter for supervision of other normal pregnancy, unspecified trimester: Secondary | ICD-10-CM | POA: Diagnosis present

## 2020-08-15 NOTE — Telephone Encounter (Signed)
Called [place] and spoke with Lauren Hawkins in regards to missing information. Lauren Hawkins states that pt's info was updated yesterday. Lauren Hawkins, CMA

## 2020-08-16 ENCOUNTER — Ambulatory Visit (INDEPENDENT_AMBULATORY_CARE_PROVIDER_SITE_OTHER): Payer: No Typology Code available for payment source

## 2020-08-16 VITALS — BP 103/59 | HR 94 | Wt 226.0 lb

## 2020-08-16 DIAGNOSIS — Z8751 Personal history of pre-term labor: Secondary | ICD-10-CM | POA: Diagnosis not present

## 2020-08-16 DIAGNOSIS — Z3A16 16 weeks gestation of pregnancy: Secondary | ICD-10-CM

## 2020-08-16 LAB — AFP, SERUM, OPEN SPINA BIFIDA
AFP MoM: 1.05
AFP Value: 27 ng/mL
Gest. Age on Collection Date: 15.1 weeks
Maternal Age At EDD: 30.2 yr
OSBR Risk 1 IN: 10000
Test Results:: NEGATIVE
Weight: 225 [lb_av]

## 2020-08-16 MED ORDER — HYDROXYPROGESTERONE CAPROATE 275 MG/1.1ML ~~LOC~~ SOAJ
275.0000 mg | Freq: Once | SUBCUTANEOUS | Status: AC
  Administered 2020-08-16: 275 mg via SUBCUTANEOUS

## 2020-08-16 NOTE — Progress Notes (Signed)
Chart reviewed - agree with CMA/RN documentation.  ° °

## 2020-08-16 NOTE — Progress Notes (Signed)
Jarissa Sweatt here for 17-P  Injection.  Injection administered without complication. Patient will return in one week for next injection.  Rebeka Kimble l Meilah Delrosario, CMA 08/16/2020  4:09 PM

## 2020-08-23 ENCOUNTER — Ambulatory Visit (INDEPENDENT_AMBULATORY_CARE_PROVIDER_SITE_OTHER): Payer: No Typology Code available for payment source

## 2020-08-23 VITALS — BP 110/67 | HR 92 | Wt 227.0 lb

## 2020-08-23 DIAGNOSIS — Z8751 Personal history of pre-term labor: Secondary | ICD-10-CM

## 2020-08-23 MED ORDER — HYDROXYPROGESTERONE CAPROATE 275 MG/1.1ML ~~LOC~~ SOAJ
275.0000 mg | Freq: Once | SUBCUTANEOUS | Status: AC
  Administered 2020-08-23: 275 mg via SUBCUTANEOUS

## 2020-08-23 NOTE — Progress Notes (Addendum)
Lauren Hawkins here for 17-P  Injection.  Injection administered without complication. Patient will return in one week for next injection.  Nilo Fallin l Malavika Lira, CMA 08/23/2020  4:09 PM   Attestation of Attending Supervision of CMA/RN: Evaluation and management procedures were performed by the nurse under my supervision and collaboration.  I have reviewed the nursing note and chart, and I agree with the management and plan.  Carolyn L. Harraway-Smith, M.D., [place]

## 2020-08-30 ENCOUNTER — Other Ambulatory Visit: Payer: Self-pay

## 2020-08-30 ENCOUNTER — Ambulatory Visit (INDEPENDENT_AMBULATORY_CARE_PROVIDER_SITE_OTHER): Payer: No Typology Code available for payment source

## 2020-08-30 DIAGNOSIS — Z8751 Personal history of pre-term labor: Secondary | ICD-10-CM | POA: Diagnosis not present

## 2020-08-30 DIAGNOSIS — Z3480 Encounter for supervision of other normal pregnancy, unspecified trimester: Secondary | ICD-10-CM

## 2020-08-30 MED ORDER — HYDROXYPROGESTERONE CAPROATE 275 MG/1.1ML ~~LOC~~ SOAJ
275.0000 mg | SUBCUTANEOUS | Status: AC
  Administered 2020-08-30 – 2021-01-03 (×16): 275 mg via SUBCUTANEOUS

## 2020-08-30 NOTE — Progress Notes (Signed)
Patient presents for [facility] injection today. Patient requested FHR due to her not really feeling movement yet. FHR 145 bpm and patient reassured.Patient is scheduled for MFM anatomy ultrasound on 09/13/2020. Armandina Stammer RN

## 2020-08-30 NOTE — Progress Notes (Signed)
Chart reviewed - agree with CMA/RN documentation.  ° °

## 2020-09-05 ENCOUNTER — Ambulatory Visit: Payer: No Typology Code available for payment source

## 2020-09-07 ENCOUNTER — Ambulatory Visit (INDEPENDENT_AMBULATORY_CARE_PROVIDER_SITE_OTHER): Payer: No Typology Code available for payment source | Admitting: Family Medicine

## 2020-09-07 ENCOUNTER — Other Ambulatory Visit: Payer: Self-pay

## 2020-09-07 ENCOUNTER — Encounter: Payer: No Typology Code available for payment source | Admitting: Family Medicine

## 2020-09-07 VITALS — BP 122/50 | HR 88 | Wt 229.0 lb

## 2020-09-07 DIAGNOSIS — R87612 Low grade squamous intraepithelial lesion on cytologic smear of cervix (LGSIL): Secondary | ICD-10-CM

## 2020-09-07 DIAGNOSIS — Z3A19 19 weeks gestation of pregnancy: Secondary | ICD-10-CM

## 2020-09-07 DIAGNOSIS — Z8751 Personal history of pre-term labor: Secondary | ICD-10-CM

## 2020-09-07 DIAGNOSIS — Z3480 Encounter for supervision of other normal pregnancy, unspecified trimester: Secondary | ICD-10-CM

## 2020-09-07 NOTE — Progress Notes (Signed)
   PRENATAL VISIT NOTE  Subjective:  Lauren Hawkins is a 30 y.o. G3P0110 at [redacted]w[redacted]d being seen today for ongoing prenatal care.  She is currently monitored for the following issues for this high-risk pregnancy and has Sciatica of left side; S/P cervical polypectomy; ASCUS with positive high risk HPV cervical; H/O preterm delivery, currently pregnant; Supervision of other normal pregnancy, antepartum; and LGSIL on Pap smear of cervix on their problem list.  Patient reports no complaints. Denies leaking of fluid.   The following portions of the patient's history were reviewed and updated as appropriate: allergies, current medications, past family history, past medical history, past social history, past surgical history and problem list.   Objective:   Vitals:   09/07/20 1606  BP: (!) 122/50  Pulse: 88  Weight: 229 lb (103.9 kg)    Fetal Status: Fetal Heart Rate (bpm): 145   Movement: Absent     General:  Alert, oriented and cooperative. Patient is in no acute distress.  Skin: Skin is warm and dry. No rash noted.   Cardiovascular: Normal heart rate noted  Respiratory: Normal respiratory effort, no problems with respiration noted  Abdomen: Soft, gravid, appropriate for gestational age.  Pain/Pressure: Absent     Pelvic: Cervical exam deferred        Extremities: Normal range of motion.  Edema: None  Mental Status: Normal mood and affect. Normal behavior. Normal judgment and thought content.   Assessment and Plan:  Pregnancy: G3P0110 at [redacted]w[redacted]d 1. Supervision of other normal pregnancy, antepartum - AFP normal, panorama showed low risk, horizon carrier negative   2. [redacted] weeks gestation of pregnancy   3. H/O preterm delivery, currently pregnant H/o 28 week delivery due to spontaneous labor. On makena. CL normal. - IM Makena q7 days   4. LGSIL on Pap smear of cervix Repeat pp per KMD  General obstetric precautions including but not limited to vaginal bleeding, contractions, leaking of  fluid and fetal movement were reviewed in detail with the patient. Please refer to After Visit Summary for other counseling recommendations.   No follow-ups on file.  Future Appointments  Date Time Provider Department Center  09/13/2020  4:00 PM [facility] NURSE [facility] None  09/20/2020  4:00 PM [facility] NURSE [facility] None  09/26/2020  2:45 PM [facility] US4 [facility]  09/27/2020  4:00 PM [facility] NURSE [facility] None  10/05/2020  4:00 PM Levie Heritage, DO [facility] None  11/02/2020  8:30 AM Levie Heritage, DO [facility] None  11/15/2020  4:00 PM Willodean Rosenthal, MD [facility] None    Levie Heritage, DO

## 2020-09-13 ENCOUNTER — Ambulatory Visit (INDEPENDENT_AMBULATORY_CARE_PROVIDER_SITE_OTHER): Payer: No Typology Code available for payment source

## 2020-09-13 ENCOUNTER — Ambulatory Visit: Payer: No Typology Code available for payment source

## 2020-09-13 VITALS — BP 110/58 | HR 97 | Ht 64.0 in | Wt 229.0 lb

## 2020-09-13 DIAGNOSIS — Z8751 Personal history of pre-term labor: Secondary | ICD-10-CM

## 2020-09-13 MED ORDER — HYDROXYPROGESTERONE CAPROATE 275 MG/1.1ML ~~LOC~~ SOAJ: 275.0000 mg | Freq: Once | SUBCUTANEOUS | Status: DC

## 2020-09-15 ENCOUNTER — Ambulatory Visit: Payer: No Typology Code available for payment source

## 2020-09-20 ENCOUNTER — Ambulatory Visit (INDEPENDENT_AMBULATORY_CARE_PROVIDER_SITE_OTHER): Payer: No Typology Code available for payment source

## 2020-09-20 ENCOUNTER — Other Ambulatory Visit: Payer: Self-pay

## 2020-09-20 ENCOUNTER — Other Ambulatory Visit (HOSPITAL_COMMUNITY)
Admission: RE | Admit: 2020-09-20 | Discharge: 2020-09-20 | Disposition: A | Discharge status: Home / Self Care | Payer: No Typology Code available for payment source | Source: Ambulatory Visit | Attending: Family Medicine | Admitting: Family Medicine

## 2020-09-20 DIAGNOSIS — Z113 Encounter for screening for infections with a predominantly sexual mode of transmission: Secondary | ICD-10-CM

## 2020-09-20 NOTE — Progress Notes (Signed)
Patient presents for her 17P injection. Patient asked if she could have GC /Chl self swab due to possible exposure. Patient given self swab injections and perform test. Armandina Stammer RN

## 2020-09-21 LAB — GC/CHLAMYDIA PROBE AMP (~~LOC~~) NOT AT ARMC
Chlamydia: NEGATIVE
Comment: NEGATIVE
Comment: NORMAL
Neisseria Gonorrhea: NEGATIVE

## 2020-09-21 NOTE — Progress Notes (Signed)
Chart reviewed - agree with CMA/RN documentation.  ° °

## 2020-09-26 ENCOUNTER — Other Ambulatory Visit: Payer: Self-pay

## 2020-09-26 ENCOUNTER — Other Ambulatory Visit: Payer: Self-pay | Admitting: Obstetrics and Gynecology

## 2020-09-26 ENCOUNTER — Ambulatory Visit: Payer: No Typology Code available for payment source

## 2020-09-26 ENCOUNTER — Other Ambulatory Visit: Payer: Self-pay | Admitting: *Deleted

## 2020-09-26 ENCOUNTER — Ambulatory Visit: Payer: No Typology Code available for payment source | Attending: Obstetrics and Gynecology

## 2020-09-26 DIAGNOSIS — Z3A22 22 weeks gestation of pregnancy: Secondary | ICD-10-CM | POA: Diagnosis not present

## 2020-09-26 DIAGNOSIS — O99212 Obesity complicating pregnancy, second trimester: Secondary | ICD-10-CM | POA: Diagnosis not present

## 2020-09-26 DIAGNOSIS — Z3480 Encounter for supervision of other normal pregnancy, unspecified trimester: Secondary | ICD-10-CM | POA: Diagnosis present

## 2020-09-26 DIAGNOSIS — O09212 Supervision of pregnancy with history of pre-term labor, second trimester: Secondary | ICD-10-CM

## 2020-09-26 DIAGNOSIS — O358XX0 Maternal care for other (suspected) fetal abnormality and damage, not applicable or unspecified: Secondary | ICD-10-CM

## 2020-09-26 DIAGNOSIS — Z362 Encounter for other antenatal screening follow-up: Secondary | ICD-10-CM

## 2020-09-27 ENCOUNTER — Other Ambulatory Visit: Payer: Self-pay

## 2020-09-27 ENCOUNTER — Ambulatory Visit (INDEPENDENT_AMBULATORY_CARE_PROVIDER_SITE_OTHER): Payer: No Typology Code available for payment source

## 2020-09-27 DIAGNOSIS — Z8751 Personal history of pre-term labor: Secondary | ICD-10-CM

## 2020-09-27 DIAGNOSIS — Z3A22 22 weeks gestation of pregnancy: Secondary | ICD-10-CM | POA: Diagnosis not present

## 2020-09-27 NOTE — Progress Notes (Signed)
Patient presents for 17 p injection. Patient provided medication. Patient will return in one week for next injection.   Patient also made aware of negative vaginal culture from last week. Armandina Stammer RN

## 2020-09-28 NOTE — Progress Notes (Signed)
Chart reviewed - agree with CMA/RN documentation.  ° °

## 2020-10-03 ENCOUNTER — Telehealth: Payer: Self-pay

## 2020-10-03 NOTE — Telephone Encounter (Signed)
Pt called wanting to know is it ok for her to fly at 23 weeks. Pt made aware that it is ok for her to fly but she will need to get up to stretch and walk around while on the plane. Understanding was voiced. Lauren Hawkins l Jahir Halt, CMA

## 2020-10-05 ENCOUNTER — Ambulatory Visit (INDEPENDENT_AMBULATORY_CARE_PROVIDER_SITE_OTHER): Payer: No Typology Code available for payment source | Admitting: Family Medicine

## 2020-10-05 ENCOUNTER — Other Ambulatory Visit: Payer: Self-pay

## 2020-10-05 VITALS — BP 110/61 | HR 91 | Wt 231.0 lb

## 2020-10-05 DIAGNOSIS — R87612 Low grade squamous intraepithelial lesion on cytologic smear of cervix (LGSIL): Secondary | ICD-10-CM

## 2020-10-05 DIAGNOSIS — Z3480 Encounter for supervision of other normal pregnancy, unspecified trimester: Secondary | ICD-10-CM

## 2020-10-05 DIAGNOSIS — O09899 Supervision of other high risk pregnancies, unspecified trimester: Secondary | ICD-10-CM

## 2020-10-05 DIAGNOSIS — Z8751 Personal history of pre-term labor: Secondary | ICD-10-CM | POA: Diagnosis not present

## 2020-10-05 DIAGNOSIS — Z3A23 23 weeks gestation of pregnancy: Secondary | ICD-10-CM

## 2020-10-05 NOTE — Progress Notes (Signed)
   PRENATAL VISIT NOTE  Subjective:  Lauren Hawkins is a 30 y.o. [id number] at [redacted]w[redacted]d being seen today for ongoing prenatal care.  She is currently monitored for the following issues for this high-risk pregnancy and has Sciatica of left side; S/P cervical polypectomy; ASCUS with positive high risk HPV cervical; H/O preterm delivery, currently pregnant; Supervision of other normal pregnancy, antepartum; and LGSIL on Pap smear of cervix on their problem list.  Patient reports no complaints.  Contractions: Not present.  .  Movement: Present. Denies leaking of fluid.   The following portions of the patient's history were reviewed and updated as appropriate: allergies, current medications, past family history, past medical history, past social history, past surgical history and problem list.   Objective:   Vitals:   10/05/20 1626  BP: 110/61  Pulse: 91  Weight: 231 lb (104.8 kg)    Fetal Status:     Movement: Present     General:  Alert, oriented and cooperative. Patient is in no acute distress.  Skin: Skin is warm and dry. No rash noted.   Cardiovascular: Normal heart rate noted  Respiratory: Normal respiratory effort, no problems with respiration noted  Abdomen: Soft, gravid, appropriate for gestational age.  Pain/Pressure: Absent     Pelvic: Cervical exam deferred        Extremities: Normal range of motion.  Edema: None  Mental Status: Normal mood and affect. Normal behavior. Normal judgment and thought content.   Assessment and Plan:  Pregnancy: G3P0111 at [redacted]w[redacted]d 1. [redacted] weeks gestation of pregnancy 2. Supervision of other normal pregnancy, antepartum FHT and FH normal  3. H/O preterm delivery, currently pregnant H/o 28 week delivery On Makena CL normal  4. LGSIL on Pap smear of cervix Rpt PAP pp.  Preterm labor symptoms and general obstetric precautions including but not limited to vaginal bleeding, contractions, leaking of fluid and fetal movement were reviewed in detail with the  patient. Please refer to After Visit Summary for other counseling recommendations.   No follow-ups on file.  Future Appointments  Date Time Provider Department Center  10/12/2020  4:00 PM [facility] NURSE [facility] None  10/19/2020  4:00 PM [facility] NURSE [facility] None  10/24/2020  2:45 PM [facility] US4 [facility]  10/26/2020  4:00 PM [facility] NURSE [facility] None  11/02/2020  8:30 AM Levie Heritage, DO [facility] None  11/03/2020  9:15 AM Willodean Rosenthal, MD [facility] None  11/09/2020  4:00 PM [facility] NURSE [facility] None  11/15/2020  4:00 PM Willodean Rosenthal, MD [facility] None  11/23/2020  4:00 PM [facility] NURSE [facility] None    Levie Heritage, DO

## 2020-10-12 ENCOUNTER — Ambulatory Visit: Payer: No Typology Code available for payment source

## 2020-10-12 ENCOUNTER — Other Ambulatory Visit: Payer: Self-pay

## 2020-10-12 ENCOUNTER — Ambulatory Visit (INDEPENDENT_AMBULATORY_CARE_PROVIDER_SITE_OTHER): Payer: No Typology Code available for payment source

## 2020-10-12 VITALS — BP 105/50 | Wt 231.0 lb

## 2020-10-12 DIAGNOSIS — O09899 Supervision of other high risk pregnancies, unspecified trimester: Secondary | ICD-10-CM | POA: Diagnosis not present

## 2020-10-12 NOTE — Progress Notes (Signed)
Pt presents today for Makena injection. Makena 275mg  given Right arm. Pt tolerated well with no adverse side effects noted. Pt to return to office in 1 week for repeat injection.

## 2020-10-19 ENCOUNTER — Other Ambulatory Visit: Payer: Self-pay

## 2020-10-19 ENCOUNTER — Ambulatory Visit (INDEPENDENT_AMBULATORY_CARE_PROVIDER_SITE_OTHER): Payer: No Typology Code available for payment source

## 2020-10-19 VITALS — BP 103/63 | Ht 63.0 in | Wt 232.0 lb

## 2020-10-19 DIAGNOSIS — O09899 Supervision of other high risk pregnancies, unspecified trimester: Secondary | ICD-10-CM | POA: Diagnosis not present

## 2020-10-19 MED ORDER — HYDROXYPROGESTERONE CAPROATE 275 MG/1.1ML ~~LOC~~ SOAJ
275.0000 mg | Freq: Once | SUBCUTANEOUS | Status: AC
  Administered 2020-10-19: 275 mg via SUBCUTANEOUS

## 2020-10-19 NOTE — Progress Notes (Cosign Needed Addendum)
Makena 275mg /1.47mL given Left arm. Pt tolerated well, no adverse side effects noted. Pt to return in one week for repeat injection.   Attestation of Attending Supervision of CMA/RN: Evaluation and management procedures were performed by the nurse under my supervision and collaboration.  I have reviewed the nursing note and chart, and I agree with the management and plan.  Carolyn L. Harraway-Smith, M.D., 0m

## 2020-10-24 ENCOUNTER — Other Ambulatory Visit: Payer: Self-pay

## 2020-10-24 ENCOUNTER — Other Ambulatory Visit: Payer: Self-pay | Admitting: *Deleted

## 2020-10-24 ENCOUNTER — Ambulatory Visit: Payer: No Typology Code available for payment source | Attending: Obstetrics and Gynecology

## 2020-10-24 DIAGNOSIS — Z6841 Body Mass Index (BMI) 40.0 and over, adult: Secondary | ICD-10-CM

## 2020-10-24 DIAGNOSIS — Z362 Encounter for other antenatal screening follow-up: Secondary | ICD-10-CM | POA: Diagnosis not present

## 2020-10-26 ENCOUNTER — Other Ambulatory Visit: Payer: Self-pay

## 2020-10-26 ENCOUNTER — Ambulatory Visit (INDEPENDENT_AMBULATORY_CARE_PROVIDER_SITE_OTHER): Payer: No Typology Code available for payment source

## 2020-10-26 VITALS — BP 111/63 | HR 101 | Wt 232.0 lb

## 2020-10-26 DIAGNOSIS — O09899 Supervision of other high risk pregnancies, unspecified trimester: Secondary | ICD-10-CM

## 2020-10-26 DIAGNOSIS — Z3A26 26 weeks gestation of pregnancy: Secondary | ICD-10-CM | POA: Diagnosis not present

## 2020-10-26 NOTE — Progress Notes (Signed)
Chart reviewed - agree with CMA/RN documentation.  ° °

## 2020-10-26 NOTE — Progress Notes (Signed)
Lauren Hawkins here for 17-P  Injection.  Injection administered without complication. Patient will return in one week for next injection.  [name], CMA 10/26/2020  2:35 PM

## 2020-11-02 ENCOUNTER — Ambulatory Visit (INDEPENDENT_AMBULATORY_CARE_PROVIDER_SITE_OTHER): Payer: Medicaid Other | Admitting: Family Medicine

## 2020-11-02 ENCOUNTER — Other Ambulatory Visit: Payer: Self-pay

## 2020-11-02 VITALS — BP 118/79 | HR 102 | Wt 230.0 lb

## 2020-11-02 DIAGNOSIS — Z8751 Personal history of pre-term labor: Secondary | ICD-10-CM | POA: Diagnosis not present

## 2020-11-02 DIAGNOSIS — R87612 Low grade squamous intraepithelial lesion on cytologic smear of cervix (LGSIL): Secondary | ICD-10-CM

## 2020-11-02 DIAGNOSIS — Z3480 Encounter for supervision of other normal pregnancy, unspecified trimester: Secondary | ICD-10-CM

## 2020-11-02 DIAGNOSIS — Z3A26 26 weeks gestation of pregnancy: Secondary | ICD-10-CM

## 2020-11-02 DIAGNOSIS — Z23 Encounter for immunization: Secondary | ICD-10-CM

## 2020-11-02 DIAGNOSIS — O09212 Supervision of pregnancy with history of pre-term labor, second trimester: Secondary | ICD-10-CM

## 2020-11-02 DIAGNOSIS — O09899 Supervision of other high risk pregnancies, unspecified trimester: Secondary | ICD-10-CM

## 2020-11-02 NOTE — Progress Notes (Addendum)
+   Fetal movement. No complaints. PHQ 9: 1, GAD 7: 4  Makena 275mg /1.78mL given Right arm. Pt tolerated well with no adverse side effects. Pt to follow up in one week for repeat injection.

## 2020-11-02 NOTE — Progress Notes (Signed)
   PRENATAL VISIT NOTE  Subjective:  Lauren Hawkins is a 30 y.o. Y3K1601 at [redacted]w[redacted]d being seen today for ongoing prenatal care.  She is currently monitored for the following issues for this low-risk pregnancy and has Sciatica of left side; S/P cervical polypectomy; ASCUS with positive high risk HPV cervical; H/O preterm delivery, currently pregnant; Supervision of other normal pregnancy, antepartum; and LGSIL on Pap smear of cervix on their problem list.  Patient reports no complaints.  Contractions: Not present. Vag. Bleeding: None.  Movement: Present. Denies leaking of fluid.   The following portions of the patient's history were reviewed and updated as appropriate: allergies, current medications, past family history, past medical history, past social history, past surgical history and problem list.   Objective:   Vitals:   11/02/20 0844  BP: 118/79  Pulse: (!) 102  Weight: 230 lb (104.3 kg)    Fetal Status: Fetal Heart Rate (bpm): 155 Fundal Height: 28 cm Movement: Present     General:  Alert, oriented and cooperative. Patient is in no acute distress.  Skin: Skin is warm and dry. No rash noted.   Cardiovascular: Normal heart rate noted  Respiratory: Normal respiratory effort, no problems with respiration noted  Abdomen: Soft, gravid, appropriate for gestational age.  Pain/Pressure: Present     Pelvic: Cervical exam deferred        Extremities: Normal range of motion.  Edema: None  Mental Status: Normal mood and affect. Normal behavior. Normal judgment and thought content.   Assessment and Plan:  Pregnancy: [id number] at [redacted]w[redacted]d 1. Supervision of other normal pregnancy, antepartum FHT and FH normal  2. [redacted] weeks gestation of pregnancy  - Glucose Tolerance, 2 Hours w/1 Hour - CBC - RPR - HIV Antibody (routine testing w rflx)  3. H/O preterm delivery, currently pregnant On makena - Glucose Tolerance, 2 Hours w/1 Hour - CBC - RPR - HIV Antibody (routine testing w rflx)  4. LSIL  on Pap smear of cervix Rpt Pap PP   Preterm labor symptoms and general obstetric precautions including but not limited to vaginal bleeding, contractions, leaking of fluid and fetal movement were reviewed in detail with the patient. Please refer to After Visit Summary for other counseling recommendations.   Return in about 2 weeks (around 11/16/2020) for OB f/u.  Future Appointments  Date Time Provider Department Center  11/09/2020  4:00 PM [facility] NURSE [facility] None  11/15/2020  4:00 PM Willodean Rosenthal, MD [facility] None  11/23/2020  4:00 PM [facility] NURSE [facility] None  12/07/2020  2:45 PM [facility] NURSE [facility]  12/07/2020  3:00 PM [facility] US1 [facility]    Levie Heritage, DO

## 2020-11-03 ENCOUNTER — Encounter: Payer: No Typology Code available for payment source | Admitting: Obstetrics & Gynecology

## 2020-11-03 LAB — CBC
Hematocrit: 35.5 % (ref 34.0–46.6)
Hemoglobin: 12 g/dL (ref 11.1–15.9)
MCH: 29.6 pg (ref 26.6–33.0)
MCHC: 33.8 g/dL (ref 31.5–35.7)
MCV: 88 fL (ref 79–97)
Platelets: 233 10*3/uL (ref 150–450)
RBC: 4.05 x10E6/uL (ref 3.77–5.28)
RDW: 12.7 % (ref 11.7–15.4)
WBC: 8 10*3/uL (ref 3.4–10.8)

## 2020-11-03 LAB — HIV ANTIBODY (ROUTINE TESTING W REFLEX): HIV Screen 4th Generation wRfx: NONREACTIVE

## 2020-11-03 LAB — GLUCOSE TOLERANCE, 2 HOURS W/ 1HR
Glucose, 1 hour: 130 mg/dL (ref 65–179)
Glucose, 2 hour: 98 mg/dL (ref 65–152)
Glucose, Fasting: 89 mg/dL (ref 65–91)

## 2020-11-03 LAB — RPR: RPR Ser Ql: NONREACTIVE

## 2020-11-09 ENCOUNTER — Other Ambulatory Visit: Payer: Self-pay

## 2020-11-09 ENCOUNTER — Ambulatory Visit (INDEPENDENT_AMBULATORY_CARE_PROVIDER_SITE_OTHER): Payer: Medicaid Other

## 2020-11-09 VITALS — BP 113/65 | HR 93 | Wt 231.0 lb

## 2020-11-09 DIAGNOSIS — Z8751 Personal history of pre-term labor: Secondary | ICD-10-CM

## 2020-11-09 DIAGNOSIS — Z3A28 28 weeks gestation of pregnancy: Secondary | ICD-10-CM

## 2020-11-09 DIAGNOSIS — O09899 Supervision of other high risk pregnancies, unspecified trimester: Secondary | ICD-10-CM

## 2020-11-09 NOTE — Progress Notes (Signed)
Lauren Hawkins here for 17-P  Injection.  Injection administered without complication. Patient will return in one week for next injection.  [name] l Milind Raether, CMA 11/09/2020  4:20 PM

## 2020-11-12 NOTE — Progress Notes (Signed)
Chart reviewed - agree with CMA/RN documentation.  ° °

## 2020-11-15 ENCOUNTER — Encounter: Payer: Self-pay | Admitting: Obstetrics & Gynecology

## 2020-11-15 ENCOUNTER — Other Ambulatory Visit: Payer: Self-pay

## 2020-11-15 ENCOUNTER — Ambulatory Visit (INDEPENDENT_AMBULATORY_CARE_PROVIDER_SITE_OTHER): Payer: Medicaid Other | Admitting: Obstetrics & Gynecology

## 2020-11-15 VITALS — BP 100/68 | HR 115 | Wt 233.0 lb

## 2020-11-15 DIAGNOSIS — Z3A29 29 weeks gestation of pregnancy: Secondary | ICD-10-CM

## 2020-11-15 DIAGNOSIS — O09899 Supervision of other high risk pregnancies, unspecified trimester: Secondary | ICD-10-CM

## 2020-11-15 DIAGNOSIS — Z8751 Personal history of pre-term labor: Secondary | ICD-10-CM | POA: Diagnosis not present

## 2020-11-15 DIAGNOSIS — Z3480 Encounter for supervision of other normal pregnancy, unspecified trimester: Secondary | ICD-10-CM

## 2020-11-15 NOTE — Patient Instructions (Signed)

## 2020-11-15 NOTE — Progress Notes (Signed)
   PRENATAL VISIT NOTE  Subjective:  Lauren Hawkins is a 30 y.o. [id number] at [redacted]w[redacted]d being seen today for ongoing prenatal care.  She is currently monitored for the following issues for this high-risk pregnancy and has Sciatica of left side; S/P cervical polypectomy; ASCUS with positive high risk HPV cervical; H/O preterm delivery, currently pregnant; Supervision of other normal pregnancy, antepartum; and LGSIL on Pap smear of cervix on their problem list.  Patient reports no complaints.  Contractions: Not present. Vag. Bleeding: None.  Movement: Present. Denies leaking of fluid.   The following portions of the patient's history were reviewed and updated as appropriate: allergies, current medications, past family history, past medical history, past social history, past surgical history and problem list.   Objective:   Vitals:   11/15/20 1446  BP: 100/68  Pulse: (!) 115  Weight: 233 lb (105.7 kg)    Fetal Status: Fetal Heart Rate (bpm): 146   Movement: Present     General:  Alert, oriented and cooperative. Patient is in no acute distress.  Skin: Skin is warm and dry. No rash noted.   Cardiovascular: Normal heart rate noted  Respiratory: Normal respiratory effort, no problems with respiration noted  Abdomen: Soft, gravid, appropriate for gestational age.  Pain/Pressure: Present     Pelvic: Cervical exam deferred        Extremities: Normal range of motion.  Edema: None  Mental Status: Normal mood and affect. Normal behavior. Normal judgment and thought content.   Assessment and Plan:  Pregnancy: G3P0111 at [redacted]w[redacted]d 1. [redacted] weeks gestation of pregnancy  10/23/2020 Est. FW:    1021  gm      2 lb 4 oz     82  % 2. Supervision of other normal pregnancy, antepartum  3. H/O preterm delivery, currently pregnant Taking 17-OH-P weekly.   Preterm labor symptoms and general obstetric precautions including but not limited to vaginal bleeding, contractions, leaking of fluid and fetal movement were  reviewed in detail with the patient. Please refer to After Visit Summary for other counseling recommendations.   Return in about 2 weeks (around 11/29/2020).  Future Appointments  Date Time Provider Department Center  11/23/2020  4:00 PM [facility] NURSE [facility] None  12/07/2020  2:45 PM [facility] NURSE [facility]  12/07/2020  3:00 PM [facility] US1 [facility]    Willodean Rosenthal, MD

## 2020-11-16 ENCOUNTER — Encounter: Payer: No Typology Code available for payment source | Admitting: Family Medicine

## 2020-11-23 ENCOUNTER — Other Ambulatory Visit: Payer: Self-pay

## 2020-11-23 ENCOUNTER — Ambulatory Visit (INDEPENDENT_AMBULATORY_CARE_PROVIDER_SITE_OTHER): Payer: Medicaid Other

## 2020-11-23 VITALS — BP 102/64 | Ht 63.0 in | Wt 231.0 lb

## 2020-11-23 DIAGNOSIS — Z8751 Personal history of pre-term labor: Secondary | ICD-10-CM | POA: Diagnosis not present

## 2020-11-23 DIAGNOSIS — O09899 Supervision of other high risk pregnancies, unspecified trimester: Secondary | ICD-10-CM

## 2020-11-23 NOTE — Progress Notes (Signed)
Makena 275mg /1.72mL given IM left arm. Pt tolerated well with not adverse side effects noted. Pt to return in one week for repeat injection.

## 2020-11-29 ENCOUNTER — Other Ambulatory Visit (HOSPITAL_COMMUNITY)
Admission: RE | Admit: 2020-11-29 | Discharge: 2020-11-29 | Disposition: A | Discharge status: Home / Self Care | Payer: Medicaid Other | Source: Ambulatory Visit | Attending: Family Medicine | Admitting: Family Medicine

## 2020-11-29 ENCOUNTER — Ambulatory Visit (INDEPENDENT_AMBULATORY_CARE_PROVIDER_SITE_OTHER): Payer: Medicaid Other | Admitting: Family Medicine

## 2020-11-29 ENCOUNTER — Encounter: Payer: Medicaid Other | Admitting: Family Medicine

## 2020-11-29 ENCOUNTER — Other Ambulatory Visit: Payer: Self-pay

## 2020-11-29 VITALS — BP 100/64 | HR 102 | Wt 232.0 lb

## 2020-11-29 DIAGNOSIS — O09899 Supervision of other high risk pregnancies, unspecified trimester: Secondary | ICD-10-CM

## 2020-11-29 DIAGNOSIS — N898 Other specified noninflammatory disorders of vagina: Secondary | ICD-10-CM | POA: Insufficient documentation

## 2020-11-29 DIAGNOSIS — Z3A31 31 weeks gestation of pregnancy: Secondary | ICD-10-CM

## 2020-11-29 DIAGNOSIS — Z3480 Encounter for supervision of other normal pregnancy, unspecified trimester: Secondary | ICD-10-CM

## 2020-11-29 NOTE — Progress Notes (Signed)
   PRENATAL VISIT NOTE  Subjective:  Lauren Hawkins is a 30 y.o. [id number] at [redacted]w[redacted]d being seen today for ongoing prenatal care.  She is currently monitored for the following issues for this high-risk pregnancy and has Sciatica of left side; S/P cervical polypectomy; ASCUS with positive high risk HPV cervical; H/O preterm delivery, currently pregnant; Supervision of other normal pregnancy, antepartum; and LGSIL on Pap smear of cervix on their problem list.  Patient reports no complaints.  Contractions: Not present. Vag. Bleeding: None.  Movement: Present. Denies leaking of fluid.   The following portions of the patient's history were reviewed and updated as appropriate: allergies, current medications, past family history, past medical history, past social history, past surgical history and problem list.   Objective:   Vitals:   11/29/20 1309  BP: 100/64  Pulse: (!) 102  Weight: 232 lb (105.2 kg)    Fetal Status: Fetal Heart Rate (bpm): 143 Fundal Height: 31 cm Movement: Present     General:  Alert, oriented and cooperative. Patient is in no acute distress.  Skin: Skin is warm and dry. No rash noted.   Cardiovascular: Normal heart rate noted  Respiratory: Normal respiratory effort, no problems with respiration noted  Abdomen: Soft, gravid, appropriate for gestational age.  Pain/Pressure: Absent     Pelvic: Cervical exam deferred        Extremities: Normal range of motion.  Edema: None  Mental Status: Normal mood and affect. Normal behavior. Normal judgment and thought content.   Assessment and Plan:  Pregnancy: G3P0111 at [redacted]w[redacted]d 1. [redacted] weeks gestation of pregnancy  2. Vaginal discharge WEt prep today  3. Supervision of other normal pregnancy, antepartum FHT and FH normal  4. H/O preterm delivery, currently pregnant On makena  Preterm labor symptoms and general obstetric precautions including but not limited to vaginal bleeding, contractions, leaking of fluid and fetal movement were  reviewed in detail with the patient. Please refer to After Visit Summary for other counseling recommendations.   No follow-ups on file.  Future Appointments  Date Time Provider Department Center  12/06/2020  4:00 PM [facility] NURSE [facility] None  12/07/2020  2:45 PM [facility] NURSE [facility]  12/07/2020  3:00 PM [facility] US1 [facility]  12/13/2020  4:00 PM [facility] NURSE [facility] None  12/14/2020  1:00 PM Levie Heritage, DO [facility] None  12/20/2020  4:00 PM [facility] NURSE [facility] None  12/27/2020  4:10 PM Willodean Rosenthal, MD [facility] None  01/03/2021  3:50 PM Willodean Rosenthal, MD [facility] None  01/10/2021  4:10 PM Levie Heritage, DO [facility] None  01/17/2021  4:10 PM Reva Bores, MD [facility] None  01/25/2021  3:50 PM Levie Heritage, DO [facility] None    Levie Heritage, DO

## 2020-11-30 LAB — CERVICOVAGINAL ANCILLARY ONLY
Bacterial Vaginitis (gardnerella): NEGATIVE
Candida Glabrata: NEGATIVE
Candida Vaginitis: POSITIVE — AB
Comment: NEGATIVE
Comment: NEGATIVE
Comment: NEGATIVE

## 2020-12-01 ENCOUNTER — Other Ambulatory Visit: Payer: Self-pay | Admitting: *Deleted

## 2020-12-01 MED ORDER — TERCONAZOLE 0.4 % VA CREA: 1.0000 | TOPICAL_CREAM | Freq: Every day | VAGINAL | 0 refills | Status: DC

## 2020-12-04 MED ORDER — FLUCONAZOLE 150 MG PO TABS: 150.0000 mg | ORAL_TABLET | Freq: Once | ORAL | 0 refills | Status: AC

## 2020-12-04 NOTE — Addendum Note (Signed)
Addended by: Levie Heritage on: 12/04/2020 04:35 PM   Modules accepted: Orders

## 2020-12-06 ENCOUNTER — Ambulatory Visit (INDEPENDENT_AMBULATORY_CARE_PROVIDER_SITE_OTHER): Payer: Medicaid Other

## 2020-12-06 ENCOUNTER — Other Ambulatory Visit: Payer: Self-pay

## 2020-12-06 VITALS — BP 91/66 | HR 112

## 2020-12-06 DIAGNOSIS — Z8751 Personal history of pre-term labor: Secondary | ICD-10-CM | POA: Diagnosis not present

## 2020-12-06 DIAGNOSIS — O09899 Supervision of other high risk pregnancies, unspecified trimester: Secondary | ICD-10-CM

## 2020-12-06 NOTE — Progress Notes (Signed)
Patient presented to the office for her 17- P injection Given by:D.Cheree Ditto in left arm IM Side Effects: none Follow up: one week SLP#[id number]

## 2020-12-07 ENCOUNTER — Ambulatory Visit: Payer: Medicaid Other | Admitting: *Deleted

## 2020-12-07 ENCOUNTER — Ambulatory Visit: Payer: Medicaid Other | Attending: Obstetrics and Gynecology

## 2020-12-07 VITALS — BP 130/70 | HR 99

## 2020-12-07 DIAGNOSIS — Z3480 Encounter for supervision of other normal pregnancy, unspecified trimester: Secondary | ICD-10-CM

## 2020-12-07 DIAGNOSIS — O358XX0 Maternal care for other (suspected) fetal abnormality and damage, not applicable or unspecified: Secondary | ICD-10-CM | POA: Diagnosis not present

## 2020-12-07 DIAGNOSIS — O99213 Obesity complicating pregnancy, third trimester: Secondary | ICD-10-CM | POA: Diagnosis not present

## 2020-12-07 DIAGNOSIS — O09213 Supervision of pregnancy with history of pre-term labor, third trimester: Secondary | ICD-10-CM

## 2020-12-07 DIAGNOSIS — Z6841 Body Mass Index (BMI) 40.0 and over, adult: Secondary | ICD-10-CM

## 2020-12-07 DIAGNOSIS — Z3A32 32 weeks gestation of pregnancy: Secondary | ICD-10-CM

## 2020-12-08 ENCOUNTER — Other Ambulatory Visit: Payer: Self-pay | Admitting: *Deleted

## 2020-12-08 DIAGNOSIS — Z6841 Body Mass Index (BMI) 40.0 and over, adult: Secondary | ICD-10-CM

## 2020-12-13 ENCOUNTER — Ambulatory Visit: Payer: Medicaid Other

## 2020-12-14 ENCOUNTER — Other Ambulatory Visit: Payer: Self-pay

## 2020-12-14 ENCOUNTER — Ambulatory Visit (INDEPENDENT_AMBULATORY_CARE_PROVIDER_SITE_OTHER): Payer: Medicaid Other | Admitting: Family Medicine

## 2020-12-14 VITALS — BP 117/67 | HR 80 | Wt 233.0 lb

## 2020-12-14 DIAGNOSIS — Z3A33 33 weeks gestation of pregnancy: Secondary | ICD-10-CM

## 2020-12-14 DIAGNOSIS — Z3480 Encounter for supervision of other normal pregnancy, unspecified trimester: Secondary | ICD-10-CM

## 2020-12-14 DIAGNOSIS — O09899 Supervision of other high risk pregnancies, unspecified trimester: Secondary | ICD-10-CM

## 2020-12-14 DIAGNOSIS — O99213 Obesity complicating pregnancy, third trimester: Secondary | ICD-10-CM

## 2020-12-14 DIAGNOSIS — Z8751 Personal history of pre-term labor: Secondary | ICD-10-CM | POA: Diagnosis not present

## 2020-12-14 DIAGNOSIS — Z6841 Body Mass Index (BMI) 40.0 and over, adult: Secondary | ICD-10-CM

## 2020-12-14 DIAGNOSIS — O09893 Supervision of other high risk pregnancies, third trimester: Secondary | ICD-10-CM

## 2020-12-14 MED ORDER — HYDROXYPROGESTERONE CAPROATE 250 MG/ML IM OIL: 250.0000 mg | TOPICAL_OIL | Freq: Once | INTRAMUSCULAR | Status: DC

## 2020-12-14 NOTE — Progress Notes (Signed)
   PRENATAL VISIT NOTE  Subjective:  Lauren Hawkins is a 30 y.o. Y1V4944 at [redacted]w[redacted]d being seen today for ongoing prenatal care.  She is currently monitored for the following issues for this low-risk pregnancy and has Sciatica of left side; S/P cervical polypectomy; ASCUS with positive high risk HPV cervical; H/O preterm delivery, currently pregnant; Supervision of other normal pregnancy, antepartum; LGSIL on Pap smear of cervix; and Class 3 severe obesity without serious comorbidity with body mass index (BMI) of 40.0 to 44.9 in adult [facility]) on their problem list.  Patient reports no complaints.  Contractions: Not present. Vag. Bleeding: None.  Movement: Present. Denies leaking of fluid.   The following portions of the patient's history were reviewed and updated as appropriate: allergies, current medications, past family history, past medical history, past social history, past surgical history and problem list.   Objective:   Vitals:   12/14/20 1318  BP: 117/67  Pulse: 80  Weight: 233 lb (105.7 kg)    Fetal Status: Fetal Heart Rate (bpm): 142 Fundal Height: 34 cm Movement: Present     General:  Alert, oriented and cooperative. Patient is in no acute distress.  Skin: Skin is warm and dry. No rash noted.   Cardiovascular: Normal heart rate noted  Respiratory: Normal respiratory effort, no problems with respiration noted  Abdomen: Soft, gravid, appropriate for gestational age.  Pain/Pressure: Absent     Pelvic: Cervical exam deferred        Extremities: Normal range of motion.  Edema: None  Mental Status: Normal mood and affect. Normal behavior. Normal judgment and thought content.   Assessment and Plan:  Pregnancy: H6P5916 at [redacted]w[redacted]d 1. Supervision of other normal pregnancy, antepartum FHT and FH normal  2. H/O preterm delivery, currently pregnant On Makena  3. Class 3 severe obesity without serious comorbidity with body mass index (BMI) of 40.0 to 44.9 in adult, unspecified obesity type  [facility]) MFM recommends antenatal testing at 36 weeks  Preterm labor symptoms and general obstetric precautions including but not limited to vaginal bleeding, contractions, leaking of fluid and fetal movement were reviewed in detail with the patient. Please refer to After Visit Summary for other counseling recommendations.   No follow-ups on file.  Future Appointments  Date Time Provider Department Center  12/20/2020  4:00 PM [facility] NURSE [facility] None  12/27/2020  4:10 PM Willodean Rosenthal, MD [facility] None  01/03/2021  3:50 PM Willodean Rosenthal, MD [facility] None  01/08/2021  3:30 PM [facility] NURSE [facility]  01/08/2021  3:45 PM [facility] US4 [facility]  01/10/2021  4:10 PM Levie Heritage, DO [facility] None  01/17/2021  4:10 PM Reva Bores, MD [facility] None  01/25/2021  3:50 PM Levie Heritage, DO [facility] None    Levie Heritage, DO

## 2020-12-20 ENCOUNTER — Ambulatory Visit (INDEPENDENT_AMBULATORY_CARE_PROVIDER_SITE_OTHER): Payer: Medicaid Other

## 2020-12-20 VITALS — BP 108/66 | HR 101

## 2020-12-20 DIAGNOSIS — Z30014 Encounter for initial prescription of intrauterine contraceptive device: Secondary | ICD-10-CM | POA: Diagnosis not present

## 2020-12-20 DIAGNOSIS — O09899 Supervision of other high risk pregnancies, unspecified trimester: Secondary | ICD-10-CM

## 2020-12-20 MED ORDER — HYDROXYPROGESTERONE CAPROATE 275 MG/1.1ML ~~LOC~~ SOAJ
275.0000 mg | Freq: Once | SUBCUTANEOUS | Status: AC
  Administered 2020-12-20: 275 mg via SUBCUTANEOUS

## 2020-12-21 NOTE — Progress Notes (Signed)
Patient presented to the office today for her 17-p injection.  Given in Left arm Route: SQ Side Effects: none at this time. NDC # [id number]

## 2020-12-27 ENCOUNTER — Encounter: Payer: Medicaid Other | Admitting: Obstetrics & Gynecology

## 2020-12-29 ENCOUNTER — Other Ambulatory Visit: Payer: Self-pay

## 2020-12-29 ENCOUNTER — Ambulatory Visit (INDEPENDENT_AMBULATORY_CARE_PROVIDER_SITE_OTHER): Payer: Medicaid Other

## 2020-12-29 ENCOUNTER — Encounter: Payer: Medicaid Other | Admitting: Family Medicine

## 2020-12-29 VITALS — BP 113/71 | HR 90 | Wt 235.0 lb

## 2020-12-29 DIAGNOSIS — Z8751 Personal history of pre-term labor: Secondary | ICD-10-CM

## 2020-12-29 NOTE — Progress Notes (Signed)
Lauren Hawkins here for 17-P  Injection.  Injection administered without complication. Patient will return in one week for next injection.  Andriea Hasegawa l Janiel Crisostomo, CMA 12/29/2020  10:39 AM

## 2020-12-29 NOTE — Progress Notes (Signed)
Patient seen and assessed by nursing staff.  Agree with documentation and plan.  

## 2021-01-03 ENCOUNTER — Other Ambulatory Visit: Payer: Self-pay

## 2021-01-03 ENCOUNTER — Ambulatory Visit (INDEPENDENT_AMBULATORY_CARE_PROVIDER_SITE_OTHER): Payer: Medicaid Other

## 2021-01-03 ENCOUNTER — Other Ambulatory Visit (HOSPITAL_COMMUNITY)
Admission: RE | Admit: 2021-01-03 | Discharge: 2021-01-03 | Disposition: A | Discharge status: Home / Self Care | Payer: Medicaid Other | Source: Ambulatory Visit | Attending: Obstetrics & Gynecology | Admitting: Obstetrics & Gynecology

## 2021-01-03 VITALS — BP 111/61 | HR 100 | Wt 235.0 lb

## 2021-01-03 DIAGNOSIS — Z3480 Encounter for supervision of other normal pregnancy, unspecified trimester: Secondary | ICD-10-CM

## 2021-01-03 DIAGNOSIS — Z8751 Personal history of pre-term labor: Secondary | ICD-10-CM

## 2021-01-03 DIAGNOSIS — Z3A36 36 weeks gestation of pregnancy: Secondary | ICD-10-CM | POA: Diagnosis not present

## 2021-01-03 NOTE — Progress Notes (Addendum)
Lauren Hawkins here for her last 17-P  Injection.  Injection administered without complication. FHR= 150 [name] l Dutch Ing, CMA 01/03/2021  4:11 PM    Patient was late for provider visit and obtained Gc/Chl via self swab for 36 week cultures.  And GBS culture obtained. Patient scheduled for provider visit next week. Armandina Stammer RN   Attestation of Attending Supervision of RN: Evaluation and management procedures were performed by the nurse under my supervision and collaboration.  I have reviewed the nursing note and chart, and I agree with the management and plan.  Carolyn L. Harraway-Smith, M.D., Evern Core

## 2021-01-05 LAB — GC/CHLAMYDIA PROBE AMP (~~LOC~~) NOT AT ARMC
Chlamydia: NEGATIVE
Comment: NEGATIVE
Comment: NORMAL
Neisseria Gonorrhea: NEGATIVE

## 2021-01-07 LAB — CULTURE, BETA STREP (GROUP B ONLY): Strep Gp B Culture: NEGATIVE

## 2021-01-08 ENCOUNTER — Ambulatory Visit: Payer: Medicaid Other | Admitting: *Deleted

## 2021-01-08 ENCOUNTER — Other Ambulatory Visit: Payer: Self-pay | Admitting: Maternal & Fetal Medicine

## 2021-01-08 ENCOUNTER — Other Ambulatory Visit: Payer: Self-pay

## 2021-01-08 ENCOUNTER — Ambulatory Visit: Payer: Medicaid Other | Attending: Obstetrics and Gynecology

## 2021-01-08 VITALS — BP 101/63 | HR 105

## 2021-01-08 DIAGNOSIS — O09213 Supervision of pregnancy with history of pre-term labor, third trimester: Secondary | ICD-10-CM

## 2021-01-08 DIAGNOSIS — O09293 Supervision of pregnancy with other poor reproductive or obstetric history, third trimester: Secondary | ICD-10-CM | POA: Diagnosis not present

## 2021-01-08 DIAGNOSIS — O99213 Obesity complicating pregnancy, third trimester: Secondary | ICD-10-CM | POA: Diagnosis not present

## 2021-01-08 DIAGNOSIS — Z6841 Body Mass Index (BMI) 40.0 and over, adult: Secondary | ICD-10-CM

## 2021-01-08 DIAGNOSIS — O3663X0 Maternal care for excessive fetal growth, third trimester, not applicable or unspecified: Secondary | ICD-10-CM | POA: Insufficient documentation

## 2021-01-08 DIAGNOSIS — Z3480 Encounter for supervision of other normal pregnancy, unspecified trimester: Secondary | ICD-10-CM | POA: Insufficient documentation

## 2021-01-08 DIAGNOSIS — Z3A36 36 weeks gestation of pregnancy: Secondary | ICD-10-CM | POA: Insufficient documentation

## 2021-01-08 DIAGNOSIS — Z362 Encounter for other antenatal screening follow-up: Secondary | ICD-10-CM | POA: Diagnosis not present

## 2021-01-08 DIAGNOSIS — O358XX0 Maternal care for other (suspected) fetal abnormality and damage, not applicable or unspecified: Secondary | ICD-10-CM

## 2021-01-09 ENCOUNTER — Other Ambulatory Visit: Payer: Self-pay | Admitting: *Deleted

## 2021-01-09 DIAGNOSIS — O3663X0 Maternal care for excessive fetal growth, third trimester, not applicable or unspecified: Secondary | ICD-10-CM

## 2021-01-09 DIAGNOSIS — O09899 Supervision of other high risk pregnancies, unspecified trimester: Secondary | ICD-10-CM

## 2021-01-09 DIAGNOSIS — O283 Abnormal ultrasonic finding on antenatal screening of mother: Secondary | ICD-10-CM

## 2021-01-10 ENCOUNTER — Other Ambulatory Visit: Payer: Self-pay

## 2021-01-10 ENCOUNTER — Ambulatory Visit (INDEPENDENT_AMBULATORY_CARE_PROVIDER_SITE_OTHER): Payer: Medicaid Other | Admitting: Family Medicine

## 2021-01-10 VITALS — BP 111/72 | HR 98 | Wt 234.0 lb

## 2021-01-10 DIAGNOSIS — Z3480 Encounter for supervision of other normal pregnancy, unspecified trimester: Secondary | ICD-10-CM

## 2021-01-10 DIAGNOSIS — R87612 Low grade squamous intraepithelial lesion on cytologic smear of cervix (LGSIL): Secondary | ICD-10-CM

## 2021-01-10 DIAGNOSIS — Z6841 Body Mass Index (BMI) 40.0 and over, adult: Secondary | ICD-10-CM

## 2021-01-10 DIAGNOSIS — O09899 Supervision of other high risk pregnancies, unspecified trimester: Secondary | ICD-10-CM

## 2021-01-10 NOTE — Progress Notes (Signed)
   PRENATAL VISIT NOTE  Subjective:  Lauren Hawkins is a 30 y.o. J4N8295 at [redacted]w[redacted]d being seen today for ongoing prenatal care.  She is currently monitored for the following issues for this low-risk pregnancy and has Sciatica of left side; S/P cervical polypectomy; ASCUS with positive high risk HPV cervical; H/O preterm delivery, currently pregnant; Supervision of other normal pregnancy, antepartum; LGSIL on Pap smear of cervix; and Class 3 severe obesity without serious comorbidity with body mass index (BMI) of 40.0 to 44.9 in adult [facility]) on their problem list.  Patient reports no complaints.  Contractions: Irritability. Vag. Bleeding: None.  Movement: Present. Denies leaking of fluid.   The following portions of the patient's history were reviewed and updated as appropriate: allergies, current medications, past family history, past medical history, past social history, past surgical history and problem list.   Objective:   Vitals:   01/10/21 1612  BP: 111/72  Pulse: 98  Weight: 234 lb (106.1 kg)    Fetal Status: Fetal Heart Rate (bpm): 145   Movement: Present     General:  Alert, oriented and cooperative. Patient is in no acute distress.  Skin: Skin is warm and dry. No rash noted.   Cardiovascular: Normal heart rate noted  Respiratory: Normal respiratory effort, no problems with respiration noted  Abdomen: Soft, gravid, appropriate for gestational age.  Pain/Pressure: Present     Pelvic: Cervical exam deferred        Extremities: Normal range of motion.  Edema: Trace  Mental Status: Normal mood and affect. Normal behavior. Normal judgment and thought content.   Assessment and Plan:  Pregnancy: A2Z3086 at [redacted]w[redacted]d 1. Supervision of other normal pregnancy, antepartum  2. Class 3 severe obesity without serious comorbidity with body mass index (BMI) of 40.0 to 44.9 in adult, unspecified obesity type (HCC) FHT and FH normal EFW 3476g (90%)  3. H/O preterm delivery, currently  pregnant  4. LGSIL on Pap smear of cervix Rpt PAP postpartum  Term labor symptoms and general obstetric precautions including but not limited to vaginal bleeding, contractions, leaking of fluid and fetal movement were reviewed in detail with the patient. Please refer to After Visit Summary for other counseling recommendations.   No follow-ups on file.  Future Appointments  Date Time Provider Department Center  01/15/2021  3:15 PM [facility] NST [facility]  01/17/2021  4:10 PM Reva Bores, MD [facility] None  01/22/2021  3:15 PM [facility] NST [facility]  01/25/2021  3:50 PM Levie Heritage, DO [facility] None  01/31/2021  2:15 PM [facility] NST [facility]    Levie Heritage, DO

## 2021-01-15 ENCOUNTER — Ambulatory Visit (INDEPENDENT_AMBULATORY_CARE_PROVIDER_SITE_OTHER): Payer: Medicaid Other

## 2021-01-15 ENCOUNTER — Ambulatory Visit: Payer: Medicaid Other | Admitting: *Deleted

## 2021-01-15 ENCOUNTER — Other Ambulatory Visit: Payer: Self-pay

## 2021-01-15 ENCOUNTER — Other Ambulatory Visit: Payer: Medicaid Other

## 2021-01-15 VITALS — BP 113/71 | HR 90

## 2021-01-15 DIAGNOSIS — O99210 Obesity complicating pregnancy, unspecified trimester: Secondary | ICD-10-CM

## 2021-01-15 NOTE — Progress Notes (Signed)

## 2021-01-16 NOTE — Progress Notes (Signed)
Patient seen and assessed by nursing staff.  Agree with documentation and plan. ? ? ?NST:  Baseline: 140 bpm, Variability: Good {> 6 bpm), Accelerations: Reactive, and Decelerations: Absent ? ?

## 2021-01-17 ENCOUNTER — Telehealth: Payer: Self-pay

## 2021-01-17 ENCOUNTER — Other Ambulatory Visit: Payer: Self-pay

## 2021-01-17 ENCOUNTER — Ambulatory Visit (INDEPENDENT_AMBULATORY_CARE_PROVIDER_SITE_OTHER): Payer: Medicaid Other | Admitting: Family Medicine

## 2021-01-17 ENCOUNTER — Encounter (HOSPITAL_COMMUNITY): Payer: Self-pay | Admitting: Obstetrics & Gynecology

## 2021-01-17 ENCOUNTER — Inpatient Hospital Stay (HOSPITAL_COMMUNITY)
Admission: AD | Admit: 2021-01-17 | Discharge: 2021-01-20 | DRG: 807 | Disposition: A | Discharge status: Home / Self Care | Payer: Medicaid Other | Attending: Obstetrics & Gynecology | Admitting: Obstetrics & Gynecology

## 2021-01-17 ENCOUNTER — Inpatient Hospital Stay (HOSPITAL_COMMUNITY): Payer: Medicaid Other | Admitting: Anesthesiology

## 2021-01-17 VITALS — BP 116/71 | HR 91 | Wt 238.0 lb

## 2021-01-17 DIAGNOSIS — Z3A38 38 weeks gestation of pregnancy: Secondary | ICD-10-CM

## 2021-01-17 DIAGNOSIS — Z20822 Contact with and (suspected) exposure to covid-19: Secondary | ICD-10-CM | POA: Diagnosis present

## 2021-01-17 DIAGNOSIS — O99214 Obesity complicating childbirth: Secondary | ICD-10-CM | POA: Diagnosis present

## 2021-01-17 DIAGNOSIS — R87612 Low grade squamous intraepithelial lesion on cytologic smear of cervix (LGSIL): Secondary | ICD-10-CM | POA: Diagnosis present

## 2021-01-17 DIAGNOSIS — Z3480 Encounter for supervision of other normal pregnancy, unspecified trimester: Secondary | ICD-10-CM

## 2021-01-17 DIAGNOSIS — O09899 Supervision of other high risk pregnancies, unspecified trimester: Secondary | ICD-10-CM

## 2021-01-17 DIAGNOSIS — O26893 Other specified pregnancy related conditions, third trimester: Secondary | ICD-10-CM | POA: Diagnosis present

## 2021-01-17 LAB — CBC
HCT: 37.7 % (ref 36.0–46.0)
Hemoglobin: 12.3 g/dL (ref 12.0–15.0)
MCH: 27.6 pg (ref 26.0–34.0)
MCHC: 32.6 g/dL (ref 30.0–36.0)
MCV: 84.5 fL (ref 80.0–100.0)
Platelets: 255 10*3/uL (ref 150–400)
RBC: 4.46 MIL/uL (ref 3.87–5.11)
RDW: 14.1 % (ref 11.5–15.5)
WBC: 9.6 10*3/uL (ref 4.0–10.5)
nRBC: 0 % (ref 0.0–0.2)

## 2021-01-17 LAB — TYPE AND SCREEN
ABO/RH(D): B POS
Antibody Screen: NEGATIVE

## 2021-01-17 MED ORDER — LIDOCAINE HCL (PF) 1 % IJ SOLN
INTRAMUSCULAR | Status: DC | PRN
  Administered 2021-01-17: 8 mL via EPIDURAL

## 2021-01-17 MED ORDER — OXYTOCIN BOLUS FROM INFUSION
333.0000 mL | Freq: Once | INTRAVENOUS | Status: AC
Start: 2021-01-17 — End: 2021-01-18
  Administered 2021-01-18: 333 mL via INTRAVENOUS

## 2021-01-17 MED ORDER — FENTANYL CITRATE (PF) 100 MCG/2ML IJ SOLN
100.0000 ug | INTRAMUSCULAR | Status: DC | PRN
  Administered 2021-01-17: 100 ug via INTRAVENOUS
  Administered 2021-01-17: 50 ug via INTRAVENOUS
  Administered 2021-01-17: 100 ug via INTRAVENOUS
  Filled 2021-01-17 (×3): qty 2

## 2021-01-17 MED ORDER — SOD CITRATE-CITRIC ACID 500-334 MG/5ML PO SOLN: 30.0000 mL | ORAL | Status: DC | PRN

## 2021-01-17 MED ORDER — FENTANYL-BUPIVACAINE-NACL 0.5-0.125-0.9 MG/250ML-% EP SOLN
12.0000 mL/h | EPIDURAL | Status: DC | PRN
  Administered 2021-01-17: 12 mL/h via EPIDURAL
  Filled 2021-01-17: qty 250

## 2021-01-17 MED ORDER — LACTATED RINGERS IV SOLN
500.0000 mL | Freq: Once | INTRAVENOUS | Status: AC
  Administered 2021-01-17: 500 mL via INTRAVENOUS

## 2021-01-17 MED ORDER — LACTATED RINGERS IV SOLN: 500.0000 mL | INTRAVENOUS | Status: DC | PRN

## 2021-01-17 MED ORDER — OXYCODONE-ACETAMINOPHEN 5-325 MG PO TABS
2.0000 | ORAL_TABLET | ORAL | Status: DC | PRN
Start: 2021-01-17 — End: 2021-01-18

## 2021-01-17 MED ORDER — OXYTOCIN-SODIUM CHLORIDE 30-0.9 UT/500ML-% IV SOLN
2.5000 [IU]/h | INTRAVENOUS | Status: DC
  Filled 2021-01-17: qty 500

## 2021-01-17 MED ORDER — EPHEDRINE 5 MG/ML INJ: 10.0000 mg | INTRAVENOUS | Status: DC | PRN

## 2021-01-17 MED ORDER — ONDANSETRON HCL 4 MG/2ML IJ SOLN: 4.0000 mg | Freq: Four times a day (QID) | INTRAMUSCULAR | Status: DC | PRN

## 2021-01-17 MED ORDER — PHENYLEPHRINE 40 MCG/ML (10ML) SYRINGE FOR IV PUSH (FOR BLOOD PRESSURE SUPPORT): 80.0000 ug | PREFILLED_SYRINGE | INTRAVENOUS | Status: DC | PRN

## 2021-01-17 MED ORDER — OXYCODONE-ACETAMINOPHEN 5-325 MG PO TABS
1.0000 | ORAL_TABLET | ORAL | Status: DC | PRN
Start: 2021-01-17 — End: 2021-01-18

## 2021-01-17 MED ORDER — ACETAMINOPHEN 325 MG PO TABS
650.0000 mg | ORAL_TABLET | ORAL | Status: DC | PRN
Start: 2021-01-17 — End: 2021-01-18

## 2021-01-17 MED ORDER — DIPHENHYDRAMINE HCL 50 MG/ML IJ SOLN: 12.5000 mg | INTRAMUSCULAR | Status: DC | PRN

## 2021-01-17 MED ORDER — LACTATED RINGERS IV SOLN: INTRAVENOUS | Status: DC

## 2021-01-17 MED ORDER — LIDOCAINE HCL (PF) 1 % IJ SOLN: 30.0000 mL | INTRAMUSCULAR | Status: DC | PRN

## 2021-01-17 NOTE — H&P (Signed)
OBSTETRIC ADMISSION HISTORY AND PHYSICAL  Lauren Hawkins is a 30 y.o. female [phone] with IUP at [redacted]w[redacted]d by LMP presenting for spontaneous onset of labor. She presented to the office at [facility] today and was 5 cm dilated and sent to L&D for admission. She reports painful contractions that are worsening over time.     She reports +FMs, No LOF, no VB, no blurry vision, headaches or peripheral edema, and RUQ pain.  She plans on breast feeding. She is undecided about birth control.  She received her prenatal care at  [facility]    Dating: By LMP --->  Estimated Date of Delivery: 01/30/21  Sono:    @[redacted]w[redacted]d , CWD, normal anatomy, cephalic presentation, 3476g, EFW   Nursing Staff Provider  Office Location [facility]  Dating  LMP  Language  English  Anatomy 69%  WNL--f/u 4 wks to complete anatomy, EIF  Flu Vaccine   Genetic Screen  NIPS: low risk  AFP: negative   TDaP Vaccine    Hgb A1C or  GTT Early 5.3 Third trimester - normal 2hr   COVID Vaccine    LAB RESULTS   Rhogam   Blood Type B/Positive/-- (02/16 1542)   Feeding Plan Breast  Antibody Negative (02/16 1542)  Contraception  Rubella 4.46 (02/16 1542)  Circumcision Yes, if boy  RPR Non Reactive (02/16 1542)   Pediatrician  [facility]  HBsAg Negative (02/16 1542)   Support Person Dana(FOB)  HCVAb negative  Prenatal Classes  HIV Non Reactive (02/16 1542)     BTL Consent  GBS   (For PCN allergy, check sensitivities)NEG   VBAC Consent  Pap LGSIL--repeat pp    Hgb Electro  Neg horizon  BP Cuff  CF Neg horizon    SMA Neg horizon    Waterbirth  [ ]  Class [ ]  Consent [ ]  CNM visit    Induction  [ ]  Orders Entered [ ] Foley Y/N   Prenatal History/Complications: Hx preterm delivery at 28 weeks  Past Medical History: Past Medical History:  Diagnosis Date   Medical history non-contributory     Past Surgical History: Past Surgical History:  Procedure Laterality Date   INDUCED ABORTION      Obstetrical History: OB History     Gravida   3   Para  1   Term      Preterm  1   AB  1   Living  1      SAB      IAB  1   Ectopic      Multiple      Live Births  1           Social History Social History   Socioeconomic History   Marital status: Single    Spouse name: Not on file   Number of children: Not on file   Years of education: Not on file   Highest education level: Not on file  Occupational History   Not on file  Tobacco Use   Smoking status: Never   Smokeless tobacco: Never  Vaping Use   Vaping Use: Never used  Substance and Sexual Activity   Alcohol use: Not Currently   Drug use: Not Currently   Sexual activity: Yes    Birth control/protection: None  Other Topics Concern   Not on file  Social History Narrative   Not on file   Social Determinants of Health   Financial Resource Strain: Not on file  Food Insecurity: Food Insecurity  Present   Worried About Programme researcher, broadcasting/film/video in the Last Year: Never true   Ran Out of Food in the Last Year: Sometimes true  Transportation Needs: No Transportation Needs   Lack of Transportation (Medical): No   Lack of Transportation (Non-Medical): No  Physical Activity: Not on file  Stress: Not on file  Social Connections: Not on file    Family History: Family History  Problem Relation Age of Onset   Hypertension Mother     Allergies: No Known Allergies  Facility-Administered Medications Prior to Admission  Medication Dose Route Frequency Provider Last Rate Last Admin   hydroxyprogesterone caproate (MAKENA) 250 mg/mL injection 250 mg  250 mg Intramuscular Once Levie Heritage, DO       Medications Prior to Admission  Medication Sig Dispense Refill Last Dose   acetaminophen (TYLENOL) 325 MG tablet Take 2 tablets (650 mg total) by mouth every 4 (four) hours as needed (for pain scale < 4). (Patient not taking: Reported on 01/08/2021) 30 tablet     aspirin EC 81 MG tablet Take 1 tablet (81 mg total) by mouth daily. Take after 12 weeks for  prevention of preeclampsia later in pregnancy 300 tablet 2    HYDROXYprogesterone caproate (MAKENA) autoinjector Inject 275 mg into the skin every 7 (seven) days. 1.05 mL 5    metroNIDAZOLE (FLAGYL) 500 MG tablet Take 1 tablet (500 mg total) by mouth 2 (two) times daily. (Patient not taking: No sig reported) 14 tablet 0    Prenatal Vit-Fe Fumarate-FA (PRENATAL MULTIVITAMIN) TABS tablet Take 1 tablet by mouth daily at 12 noon. 30 tablet 3    terconazole (TERAZOL 7) 0.4 % vaginal cream Place 1 applicator vaginally at bedtime. (Patient not taking: Reported on 01/08/2021) 45 g 0      Review of Systems   All systems reviewed and negative except as stated in HPI  Temperature 98.1 F (36.7 C), last menstrual period 04/25/2020, currently breastfeeding. General appearance: alert, cooperative, and no distress Lungs: clear to auscultation bilaterally Heart: regular rate and rhythm Abdomen: soft, non-tender; bowel sounds normal Extremities: Homans sign is negative, no sign of DVT DTR's wnl Presentation: cephalic Fetal monitoringBaseline: 135 bpm Uterine activityFrequency: 5-7 min Dilation: 5.5 Effacement (%): 80 Station: -1 Exam by:: Junie Panning CNM   Prenatal labs: ABO, Rh: B/Positive/-- (02/16 1542) Antibody: Negative (02/16 1542) Rubella: 4.46 (02/16 1542) RPR: Non Reactive (06/09 0927)  HBsAg: Negative (02/16 1542)  HIV: Non Reactive (06/09 0927)  GBS: Negative/-- (08/10 1634)  2 hour glucola wnl, Fasting 89, 1 hour 130, 2 hour 98 Genetic screening  low risk NIPS Anatomy US wnl  Prenatal Transfer Tool  Maternal Diabetes: No Genetic Screening: Normal Maternal Ultrasounds/Referrals: Normal Fetal Ultrasounds or other Referrals:  None Maternal Substance Abuse:  No Significant Maternal Medications:  None Significant Maternal Lab Results: Group B Strep negative  No results found for this or any previous visit (from the past 24 hour(s)).  Patient Active Problem List    Diagnosis Date Noted   Normal labor 01/17/2021   Class 3 severe obesity without serious comorbidity with body mass index (BMI) of 40.0 to 44.9 in adult [facility]) 12/14/2020   LGSIL on Pap smear of cervix 07/20/2020   H/O preterm delivery, currently pregnant 07/12/2020   Supervision of other normal pregnancy, antepartum 07/12/2020   Sciatica of left side 09/09/2018   S/P cervical polypectomy 09/09/2018   ASCUS with positive high risk HPV cervical 03/11/2018    Assessment/Plan:  Corky Sox  Nifong is a 30 y.o. [id number] at [redacted]w[redacted]d here for active labor at term  #Labor:expectant management #Pain: IV pain medication ordered PRN, may have epidural when desired #FWB: Category I #ID:  GBS neg, COVID swab pending #MOF: breast #MOC:unknown #Circ:  yes  Sharen Counter, CNM  01/17/2021, 5:11 PM

## 2021-01-17 NOTE — Progress Notes (Signed)
   PRENATAL VISIT NOTE  Subjective:  Lauren Hawkins is a 30 y.o. A3F5732 at [redacted]w[redacted]d being seen today for ongoing prenatal care.  She is currently monitored for the following issues for this high-risk pregnancy and has Sciatica of left side; S/P cervical polypectomy; ASCUS with positive high risk HPV cervical; H/O preterm delivery, currently pregnant; Supervision of other normal pregnancy, antepartum; LGSIL on Pap smear of cervix; and Class 3 severe obesity without serious comorbidity with body mass index (BMI) of 40.0 to 44.9 in adult [facility]) on their problem list.  Patient reports contractions since 0530 .  Contractions: Irregular. Vag. Bleeding: None.  Movement: Present. Denies leaking of fluid.   The following portions of the patient's history were reviewed and updated as appropriate: allergies, current medications, past family history, past medical history, past social history, past surgical history and problem list.   Objective:   Vitals:   01/17/21 1412  BP: 116/71  Pulse: 91  Weight: 238 lb (108 kg)    Fetal Status: Fetal Heart Rate (bpm): 135   Movement: Present  Presentation: Vertex  General:  Alert, oriented and cooperative. Patient is in no acute distress.  Skin: Skin is warm and dry. No rash noted.   Cardiovascular: Normal heart rate noted  Respiratory: Normal respiratory effort, no problems with respiration noted  Abdomen: Soft, gravid, appropriate for gestational age.  Pain/Pressure: Present     Pelvic: Cervical exam performed in the presence of a chaperone Dilation: 5 Effacement (%): 80 Station: -2  Extremities: Normal range of motion.  Edema: Trace  Mental Status: Normal mood and affect. Normal behavior. Normal judgment and thought content.   Assessment and Plan:  Pregnancy: [id number] at [redacted]w[redacted]d 1. Supervision of other normal pregnancy, antepartum In labor--have asked her to go to the hospital. L and D had no beds, so sent through [facility]U--  Term labor symptoms and general  obstetric precautions including but not limited to vaginal bleeding, contractions, leaking of fluid and fetal movement were reviewed in detail with the patient. Please refer to After Visit Summary for other counseling recommendations.   Return in 1 week (on 01/24/2021).  Future Appointments  Date Time Provider Department Center  01/22/2021  3:15 PM [facility]ST [facility]  02/21/2021  3:10 PM Levie Heritage, DO [facility] None    Reva Bores, MD

## 2021-01-17 NOTE — Anesthesia Procedure Notes (Signed)
Epidural Patient location during procedure: OB Start time: 01/17/2021 9:30 PM End time: 01/17/2021 9:40 PM  Staffing Anesthesiologist: Mellody Dance, MD Performed: anesthesiologist   Preanesthetic Checklist Completed: patient identified, IV checked, site marked, risks and benefits discussed, monitors and equipment checked, pre-op evaluation and timeout performed  Epidural Patient position: sitting Prep: DuraPrep Patient monitoring: heart rate, cardiac monitor, continuous pulse ox and blood pressure Approach: midline Location: L3-L4 Injection technique: LOR saline  Needle:  Needle type: Tuohy  Needle gauge: 17 G Needle length: 9 cm Needle insertion depth: 7 cm Catheter type: closed end flexible Catheter size: 20 Guage Catheter at skin depth: 12 cm Test dose: negative and Other  Assessment Events: blood not aspirated, injection not painful, no injection resistance and negative IV test  Additional Notes Informed consent obtained prior to proceeding including risk of failure, 1% risk of PDPH, risk of minor discomfort and bruising.  Discussed rare but serious complications including epidural abscess, permanent nerve injury, epidural hematoma.  Discussed alternatives to epidural analgesia and patient desires to proceed.  Timeout performed pre-procedure verifying patient name, procedure, and platelet count.  Patient tolerated procedure well.

## 2021-01-17 NOTE — Progress Notes (Signed)
Patient ID: Lauren Hawkins, female   DOB: December 16, 1990, 30 y.o.   MRN: [id number]  S/p Fentanyl ~1hr ago; requesting another dose; breathing w ctx  BP 98/53, P 78 FHR 120s, Cat 1, +accels, no decels Ctx q 2 mins, spont Cx deferred per pt request (was 5+/80/vtx -1 @ 1800)  IUP@38 .1wks Active labor GBS neg  -Continue to manage expectantly> rev'd all options including hydrotherapy in shower, sitting on ball, getting another dose of IV meds; she elects another dose of Fentanyl -Anticipate vag del  [name] CNM 01/17/2021 9:11 PM

## 2021-01-17 NOTE — Anesthesia Preprocedure Evaluation (Signed)
Anesthesia Evaluation  Patient identified by MRN, date of birth, ID band Patient awake    Reviewed: Allergy & Precautions, Patient's Chart, lab work & pertinent test results  Airway Mallampati: II  TM Distance: >3 FB Neck ROM: Full    Dental no notable dental hx. (+) Teeth Intact   Pulmonary neg pulmonary ROS,    Pulmonary exam normal breath sounds clear to auscultation       Cardiovascular negative cardio ROS Normal cardiovascular exam Rhythm:Regular Rate:Normal     Neuro/Psych  Neuromuscular disease (sciatica (left)) negative psych ROS   GI/Hepatic Neg liver ROS, GERD  ,  Endo/Other  Morbid obesity  Renal/GU negative Renal ROS  negative genitourinary   Musculoskeletal Left Sciatica   Abdominal (+) + obese,   Peds  Hematology   Anesthesia Other Findings   Reproductive/Obstetrics (+) Pregnancy                             Anesthesia Physical  Anesthesia Plan  ASA: 2  Anesthesia Plan: Epidural   Post-op Pain Management:    Induction:   PONV Risk Score and Plan:   Airway Management Planned: Natural Airway  Additional Equipment:   Intra-op Plan:   Post-operative Plan:   Informed Consent: I have reviewed the patients History and Physical, chart, labs and discussed the procedure including the risks, benefits and alternatives for the proposed anesthesia with the patient or authorized representative who has indicated his/her understanding and acceptance.       Plan Discussed with: Anesthesiologist  Anesthesia Plan Comments:         Anesthesia Quick Evaluation

## 2021-01-17 NOTE — Telephone Encounter (Signed)
Patient called stating shes has been having pains on and off for last 4 hours. Patient is 38 weeks and has history of preterm delivery. Patient states baby is moving well and denies any leaking of fluid or bleeding. Patient instructed to continue to monitor and write down every time the pain/pressure starts. Patient instructed to monitor for and see if her contractions are coming regularly and if so she may go to Maternity Admission unit for evaluation. Patient states understanding. Armandina Stammer RN

## 2021-01-18 ENCOUNTER — Encounter (HOSPITAL_COMMUNITY): Payer: Self-pay | Admitting: Obstetrics & Gynecology

## 2021-01-18 DIAGNOSIS — Z3A38 38 weeks gestation of pregnancy: Secondary | ICD-10-CM

## 2021-01-18 LAB — RPR: RPR Ser Ql: NONREACTIVE

## 2021-01-18 LAB — SARS CORONAVIRUS 2 (TAT 6-24 HRS): SARS Coronavirus 2: NEGATIVE

## 2021-01-18 MED ORDER — OXYCODONE HCL 5 MG PO TABS: 5.0000 mg | ORAL_TABLET | ORAL | Status: DC | PRN

## 2021-01-18 MED ORDER — ONDANSETRON HCL 4 MG PO TABS: 4.0000 mg | ORAL_TABLET | ORAL | Status: DC | PRN

## 2021-01-18 MED ORDER — PRENATAL MULTIVITAMIN CH
1.0000 | ORAL_TABLET | Freq: Every day | ORAL | Status: DC
  Administered 2021-01-18 – 2021-01-20 (×3): 1 via ORAL
  Filled 2021-01-18 (×3): qty 1

## 2021-01-18 MED ORDER — WITCH HAZEL-GLYCERIN EX PADS: 1.0000 "application " | MEDICATED_PAD | CUTANEOUS | Status: DC | PRN

## 2021-01-18 MED ORDER — DIPHENHYDRAMINE HCL 25 MG PO CAPS
25.0000 mg | ORAL_CAPSULE | Freq: Four times a day (QID) | ORAL | Status: DC | PRN
Start: 2021-01-18 — End: 2021-01-20

## 2021-01-18 MED ORDER — SENNOSIDES-DOCUSATE SODIUM 8.6-50 MG PO TABS
2.0000 | ORAL_TABLET | ORAL | Status: DC
  Administered 2021-01-18 – 2021-01-19 (×2): 2 via ORAL
  Filled 2021-01-18 (×2): qty 2

## 2021-01-18 MED ORDER — COCONUT OIL OIL
1.0000 "application " | TOPICAL_OIL | Status: DC | PRN
  Administered 2021-01-18: 1 via TOPICAL

## 2021-01-18 MED ORDER — IBUPROFEN 600 MG PO TABS
600.0000 mg | ORAL_TABLET | Freq: Four times a day (QID) | ORAL | Status: DC
  Administered 2021-01-18 – 2021-01-20 (×8): 600 mg via ORAL
  Filled 2021-01-18 (×9): qty 1

## 2021-01-18 MED ORDER — TETANUS-DIPHTH-ACELL PERTUSSIS 5-2.5-18.5 LF-MCG/0.5 IM SUSY
0.5000 mL | PREFILLED_SYRINGE | Freq: Once | INTRAMUSCULAR | Status: DC
Start: 2021-01-19 — End: 2021-01-18

## 2021-01-18 MED ORDER — BENZOCAINE-MENTHOL 20-0.5 % EX AERO
1.0000 "application " | INHALATION_SPRAY | CUTANEOUS | Status: DC | PRN
  Administered 2021-01-18: 1 via TOPICAL
  Filled 2021-01-18: qty 56

## 2021-01-18 MED ORDER — SIMETHICONE 80 MG PO CHEW: 80.0000 mg | CHEWABLE_TABLET | ORAL | Status: DC | PRN

## 2021-01-18 MED ORDER — DIBUCAINE (PERIANAL) 1 % EX OINT: 1.0000 "application " | TOPICAL_OINTMENT | CUTANEOUS | Status: DC | PRN

## 2021-01-18 MED ORDER — ACETAMINOPHEN 325 MG PO TABS
650.0000 mg | ORAL_TABLET | ORAL | Status: DC | PRN
Start: 2021-01-18 — End: 2021-01-20

## 2021-01-18 MED ORDER — ONDANSETRON HCL 4 MG/2ML IJ SOLN: 4.0000 mg | INTRAMUSCULAR | Status: DC | PRN

## 2021-01-18 MED ORDER — MEASLES, MUMPS & RUBELLA VAC IJ SOLR
0.5000 mL | Freq: Once | INTRAMUSCULAR | Status: DC
Start: 2021-01-19 — End: 2021-01-20

## 2021-01-18 MED ORDER — ZOLPIDEM TARTRATE 5 MG PO TABS: 5.0000 mg | ORAL_TABLET | Freq: Every evening | ORAL | Status: DC | PRN

## 2021-01-18 NOTE — Discharge Summary (Signed)
Postpartum Discharge Summary  Date of Service updated 01/20/21     Patient Name: Lauren Hawkins DOB: Nov 04, 1990 MRN: [id number]  Date of admission: 01/17/2021 Delivery date:01/18/2021  Delivering provider: Serita Grammes D  Date of discharge: 01/20/2021  Admitting diagnosis: Normal labor [O80, Z37.9] Intrauterine pregnancy: [redacted]w[redacted]d    Secondary diagnosis:  Active Problems:   H/O preterm delivery, currently pregnant   LGSIL on Pap smear of cervix   Normal labor  Additional problems: none    Discharge diagnosis: Term Pregnancy Delivered                                              Post partum procedures: none Augmentation: AROM Complications: None  Hospital course: Onset of Labor With Vaginal Delivery      30y.o. yo [id number] admitted in Latent Labor on 01/17/2021. Patient had an uncomplicated labor course, progressing to complete spontaneously, and then having AROM as the membranes were bulging out of her vagina, with vag del shortly after.  Membrane Rupture Time/Date: 2:54 AM ,01/18/2021   Delivery Method:Vaginal, Spontaneous  Episiotomy: None  Lacerations:  1st degree;Vaginal  Patient had an uncomplicated postpartum course.  She is ambulating, tolerating a regular diet, passing flatus, and urinating well. Patient is discharged home in stable condition on 01/20/21.  Newborn Data: Birth date:01/18/2021  Birth time:3:16 AM  Gender:Female  Living status:Living  Apgars:8 ,9  Weight:3204 g (7lb 1oz)  Magnesium Sulfate received: No BMZ received: No Rhophylac:N/A MMR:N/A T-DaP: offered postpartum Flu: N/A Transfusion:No  Physical exam  Vitals:   01/19/21 0548 01/19/21 1440 01/19/21 2128 01/20/21 0619  BP: 120/70 107/66 112/71 113/70  Pulse: 77 76 74 64  Resp: '18 18 18 18  ' Temp: 98 F (36.7 C) 98.2 F (36.8 C) 97.6 F (36.4 C) 97.8 F (36.6 C)  TempSrc: Oral Oral Oral Oral  SpO2: 99% 98%  100%   General: alert, cooperative, and no distress Lochia:  appropriate Uterine Fundus: firm Incision: N/A DVT Evaluation: No evidence of DVT seen on physical exam. Labs: Lab Results  Component Value Date   WBC 9.6 01/17/2021   HGB 12.3 01/17/2021   HCT 37.7 01/17/2021   MCV 84.5 01/17/2021   PLT 255 01/17/2021   No flowsheet data found. Edinburgh Score: Edinburgh Postnatal Depression Scale Screening Tool 01/19/2021  I have been able to laugh and see the funny side of things. 0  I have looked forward with enjoyment to things. 0  I have blamed myself unnecessarily when things went wrong. 2  I have been anxious or worried for no good reason. 1  I have felt scared or panicky for no good reason. 0  Things have been getting on top of me. 1  I have been so unhappy that I have had difficulty sleeping. 0  I have felt sad or miserable. 0  I have been so unhappy that I have been crying. 0  The thought of harming myself has occurred to me. 0  Edinburgh Postnatal Depression Scale Total 4     After visit meds:  Allergies as of 01/20/2021   No Known Allergies      Medication List     STOP taking these medications    aspirin EC 81 MG tablet   Makena autoinjector Generic drug: HYDROXYprogesterone caproate   metroNIDAZOLE 500 MG tablet Commonly known  as: FLAGYL   terconazole 0.4 % vaginal cream Commonly known as: TERAZOL 7       TAKE these medications    acetaminophen 325 MG tablet Commonly known as: Tylenol Take 2 tablets (650 mg total) by mouth every 4 (four) hours as needed (for pain scale < 4).   ibuprofen 600 MG tablet Commonly known as: ADVIL Take 1 tablet (600 mg total) by mouth every 6 (six) hours.   prenatal multivitamin Tabs tablet Take 1 tablet by mouth daily at 12 noon.         Discharge home in stable condition Infant Feeding: Breast Infant Disposition:home with mother Discharge instruction: per After Visit Summary and Postpartum booklet. Activity: Advance as tolerated. Pelvic rest for 6 weeks.  Diet:  routine diet Future Appointments: Future Appointments  Date Time Provider [place]  02/21/2021  3:10 PM Truett Mainland, DO [facility] None   Follow up Visit:  [facility] Follow up.   Specialty: Obstetrics and Gynecology Why: In 4-6 weeks for postpartum visit Contact information: [place] [phone]               Myrtis Ser, CNM  P [facility] Admin Please schedule this patient for Postpartum visit in: 4 weeks with the following provider: Any provider  In-Person  For C/S patients schedule nurse incision check in weeks 2 weeks: no  Low risk pregnancy complicated by: none  Delivery mode:  SVD  Anticipated Birth Control:  other/unsure  PP Procedures needed: needs Pap pp  Schedule Integrated BH visit: no    01/20/2021 Fatima Blank, CNM

## 2021-01-18 NOTE — Progress Notes (Signed)
Patient ID: Lauren Hawkins, female   DOB: 04/12/1991, 30 y.o.   MRN: [id number]  Pushing now; making progress  BP 113/62 FHR 150s, accels to 180, variables to 90s after pushing Ctx q 2-3 mins Vtx visible w pushing; AROMed bulging membranes for clear fluid  IUP@38 .2wks End 1st stage  Continue pushing in various positions to help w variable decels Anticipate vag del  [name] [facility] 01/18/2021 3:02 AM

## 2021-01-18 NOTE — Lactation Note (Signed)
This note was copied from a baby's chart. Lactation Consultation Note  Patient Name: Lauren Hawkins DSWVT'V Date: 01/18/2021   Age:29 hours  LC in to visit with P2 Mom.  Mom was sleeping while FOB was holding swaddled baby.  Baby has latched and fed at the breast twice since birth.  Recommended baby be placed STS upon Mom waking to encourage feedings.  Encouraged FOB to call for assistance with latching to breast prn.   [name] 01/18/2021, 11:22 AM

## 2021-01-18 NOTE — Anesthesia Postprocedure Evaluation (Signed)
Anesthesia Post Note  Patient: Lauren Hawkins  Procedure(s) Performed: AN AD HOC LABOR EPIDURAL     Patient location during evaluation: Mother Baby Anesthesia Type: Epidural Level of consciousness: awake and alert and oriented Pain management: satisfactory to patient Vital Signs Assessment: post-procedure vital signs reviewed and stable Respiratory status: respiratory function stable Cardiovascular status: stable Postop Assessment: no headache, no backache, epidural receding, patient able to bend at knees, no signs of nausea or vomiting, adequate PO intake and able to ambulate Anesthetic complications: no   No notable events documented.  Last Vitals:  Vitals:   01/18/21 0508 01/18/21 0620  BP: 117/68 128/77  Pulse: 73 87  Resp: 18 18  Temp: 37.4 C 37 C  SpO2: 99% 100%    Last Pain:  Vitals:   01/18/21 0620  TempSrc: Oral  PainSc: 0-No pain   Pain Goal:                   Monti Jilek

## 2021-01-19 NOTE — Lactation Note (Signed)
This note was copied from a baby's chart. Lactation Consultation Note  Patient Name: Lauren Hawkins RDEYC'X Date: 01/19/2021 Reason for consult: Initial assessment Age:30 Hours  P2, Mother Pumped for 8 months with a 28 week NICU infant.  This is her first infant to breastfeed.  Assist mother with latch . Infant sustained latch for 25 mins. No pinching as before. Observed good depth and active swallows.  [facility] brochure given and basic teaching done.  Discussed feeding infant with early feeding cues , encouraged to do frequent STS, Discussed cluster feeding.  Staff nurse set up a DEBP at mothers request. LC reviewed milk storage guidelines , cleaning and pump parts.  Mother is using a #24 flange. Mother is hand expressing and pumping . She has pumped 5-7 ml . Infant has has 19 ml of ebm in addition to breastfeeding on cue.   Maternal Data Has patient been taught Hand Expression?: Yes Does the patient have breastfeeding experience prior to this delivery?: Yes (pumping only) How long did the patient breastfeed?: pumped for 28 week NICU infant for 8 months, no breast attempt  Feeding Mother's Current Feeding Choice: Breast Milk  LATCH Score Latch: Grasps breast easily, tongue down, lips flanged, rhythmical sucking.  Audible Swallowing: Spontaneous and intermittent  Type of Nipple: Everted at rest and after stimulation  Comfort (Breast/Nipple): Soft / non-tender  Hold (Positioning): Assistance needed to correctly position infant at breast and maintain latch.  LATCH Score: 9   Lactation Tools Discussed/Used Tools: Pump;Flanges Flange Size: 24 Breast pump type: Double-Electric Breast Pump Pump Education: Setup, frequency, and cleaning;Milk Storage Reason for Pumping: mother request Pumping frequency: every 3 hours Pumped volume: 7 mL  Interventions Interventions: Breast feeding basics reviewed;Assisted with latch;Skin to skin;Hand express;Breast compression;Adjust  position;Support pillows;Position options;DEBP;Education  Discharge Pump:  (mother has order a DEBP for home use) WIC Program: Yes  Consult Status Consult Status: Follow-up Date: 01/20/21 Follow-up type: In-patient    Stevan Born [facility] 01/19/2021, 8:37 AM

## 2021-01-19 NOTE — Progress Notes (Signed)
Post Partum Day 1 Subjective: no complaints, up ad lib, voiding and tolerating PO, small lochia, plans to breastfeed,  plans outpt IUD  Objective: Blood pressure 120/70, pulse 77, temperature 98 F (36.7 C), temperature source Oral, resp. rate 18, last menstrual period 04/25/2020, SpO2 99 %, unknown if currently breastfeeding.  Physical Exam:  General: alert, cooperative and no distress Lochia:normal flow Chest: CTAB Heart: RRR no m/r/g Abdomen: +BS, soft, nontender,  Uterine Fundus: firm DVT Evaluation: No evidence of DVT seen on physical exam. Extremities: trace edema  Recent Labs    01/17/21 1730  HGB 12.3  HCT 37.7    Assessment/Plan: Plan for discharge tomorrow and Circumcision prior to discharge   LOS: 2 days   Lauren Hawkins 01/19/2021, 7:50 AM

## 2021-01-19 NOTE — Lactation Note (Signed)
This note was copied from a baby's chart. Lactation Consultation Note  Patient Name: Lauren Hawkins OYDXA'J Date: 01/19/2021 Reason for consult: Follow-up assessment;Mother's request;Early term 37-38.6wks Age:30 hours P2, mom concern that she was having pain with latch. Per mom, when using the DEBP she is getting 5 mls of colostrum that she has been bottle feeding infant after latching infant at the breast.  LC assisted mom, mom latched infant on her right breast using cross cradle hold, infant latched with depth, swallows observed, infant was still breastfeeding after 15 minutes when LC left the room. Mom will use coconut oil for breast soreness, mom doesn't have any breast trauma, when infant briefly came off the breast mom's nipples were rounded and not pinched.  Mom knows to call RN or LC if she has any breastfeeding questions, concerns or need further assistance with latching infant at the breast. Mom knows to breastfeed infant by cues, 8 to 12+ times within 24 hours, skin to skin.  Maternal Data    Feeding Mother's Current Feeding Choice: Breast Milk  LATCH Score Latch: Grasps breast easily, tongue down, lips flanged, rhythmical sucking.  Audible Swallowing: Spontaneous and intermittent  Type of Nipple: Everted at rest and after stimulation  Comfort (Breast/Nipple): Soft / non-tender  Hold (Positioning): Assistance needed to correctly position infant at breast and maintain latch.  LATCH Score: 9   Lactation Tools Discussed/Used    Interventions Interventions: Skin to skin;Assisted with latch;Breast compression;Adjust position;Support pillows;Position options;Expressed milk;Education  Discharge    Consult Status Consult Status: Follow-up Date: 01/20/21 Follow-up type: In-patient    Danelle Earthly 01/19/2021, 7:31 PM

## 2021-01-20 MED ORDER — IBUPROFEN 600 MG PO TABS: 600.0000 mg | ORAL_TABLET | Freq: Four times a day (QID) | ORAL | 0 refills | Status: DC

## 2021-01-20 NOTE — Lactation Note (Signed)
This note was copied from a baby's chart. Lactation Consultation Note  Patient Name: Lauren Hawkins CXKGY'J Date: 01/20/2021 Reason for consult: Follow-up assessment;Early term 37-38.6wks;Infant weight loss;Other (Comment) (8 % weight loss/ elevated Bili check this am/ milk coming in) Age:30 hours/ Bili check at 50 hours 13- High intermediate risk zone  Per mom and the MBURN baby recently fed 15 ml of EBM/ baby asleep.  Per mom having soreness with latch. LC offered to assess breast tissue and had mom compress areola and noted edema which may be causing the discomfort with latching. LC encouraged mom to call with feeding cues for latch assessment and to evaluate discomfort.  LC plan :  Breast shells between feedings except when sleeping  Prior to latch - hand express, pre -pump with hand pump if needed and reverse pressure ( as shown )  Feed with feeding cues and by 3 hours due to 8 % weight loss  Breast feeding goal in 24 hours ( 8-12 times )  If baby doesn't latch , need to pump both breast for 15 -20 mins/ save milk for the next feeding.    Maternal Data Has patient been taught Hand Expression?: Yes  Feeding Mother's Current Feeding Choice: Breast Milk Nipple Type: Extra Slow Flow  LATCH Score                    Lactation Tools Discussed/Used Tools: Shells;Flanges;Pump Flange Size: 24 Breast pump type: Manual;Double-Electric Breast Pump Pump Education: Milk Storage  Interventions Interventions: Breast feeding basics reviewed;Shells;Hand pump;DEBP;Education  Discharge Pump: Personal;Manual;DEBP  Consult Status Consult Status: Follow-up Date: 01/20/21 Follow-up type: In-patient    Lauren Hawkins 01/20/2021, 9:24 AM

## 2021-01-20 NOTE — Lactation Note (Signed)
This note was copied from a baby's chart. Lactation Consultation Note  Patient Name: Lauren Hawkins CLEXN'T Date: 01/20/2021 Reason for consult: Follow-up assessment;Mother's request;Early term 37-38.6wks;Infant weight loss;Other (Comment);Nipple pain/trauma (8 % weight loss / baby Latched when LC entered the room - see Latch assessment) Age:30 hours 2nd LC visit today to check the latch.  Baby was latched with depth and swallows / per mom feeling pinching during the feeding.  When baby released / nipple well rounded and easily expressed milk.  LC suspects / mom had early areola swelling and baby wasn't latching deep enough and mom experienced soreness and now the soreness has to get better..  LC had provided shells 1st visit and per mom seems to help.  LC Plan for sore nipple prior to latch  Breast massage, hand express, pre-pump and reverse pressure prior to latch.  Latch with breast compressions until comfortable.  Post pump due to 8 % weight loss .  LC also recommended may have to give the baby and appetizer prior to latch to calm mouth down.  LC assess oral cavity after feeding and when the baby sucked on the [facility] 's gloved finger , back of the tongue slight hump.  Upper lip stretches well , and baby extends 1/2 inch over gum line.   Maternal Data Has patient been taught Hand Expression?: Yes  Feeding Mother's Current Feeding Choice: Breast Milk and Formula  LATCH Score Latch:  (latched with depth)  Audible Swallowing:  (swallows noted / increased with breast compressions)  Type of Nipple:  (nipple well rounded when baby released)  Comfort (Breast/Nipple):  (per m om intermittent nipple discomfort)  Hold (Positioning):  (mom had latched the baby)      Lactation Tools Discussed/Used Tools: Shells;Pump;Flanges Flange Size: 24;27 Breast pump type: Manual;Double-Electric Breast Pump Pump Education: Milk Storage  Interventions Interventions: Breast feeding basics  reviewed;Education;Shells;Hand pump;DEBP  Discharge Discharge Education: Engorgement and breast care Pump: Personal;Manual;DEBP  Consult Status Consult Status: Complete Date: 01/20/21 Follow-up type: In-patient    Lauren Hawkins 01/20/2021, 12:03 PM

## 2021-01-20 NOTE — Plan of Care (Signed)
Discharge instructions given with after visit summary, pt receptive.  

## 2021-01-22 ENCOUNTER — Other Ambulatory Visit: Payer: Self-pay

## 2021-01-22 ENCOUNTER — Other Ambulatory Visit: Payer: Medicaid Other

## 2021-01-25 ENCOUNTER — Encounter: Payer: Medicaid Other | Admitting: Family Medicine

## 2021-01-31 ENCOUNTER — Telehealth (HOSPITAL_COMMUNITY): Payer: Self-pay | Admitting: *Deleted

## 2021-01-31 ENCOUNTER — Other Ambulatory Visit: Payer: Self-pay

## 2021-01-31 NOTE — Telephone Encounter (Signed)
Mom reports feeling good. No concerns about herself. EPDS=4 [facility] score=4) Mom reports baby is fine. Feeding, peeing, and pooping without difficulty. No concerns about baby.  Duffy Rhody, RN 01-31-2021 at 2:10pm

## 2021-02-21 ENCOUNTER — Other Ambulatory Visit: Payer: Self-pay

## 2021-02-21 ENCOUNTER — Ambulatory Visit (INDEPENDENT_AMBULATORY_CARE_PROVIDER_SITE_OTHER): Payer: Medicaid Other | Admitting: Family Medicine

## 2021-02-21 ENCOUNTER — Other Ambulatory Visit (HOSPITAL_COMMUNITY)
Admission: RE | Admit: 2021-02-21 | Discharge: 2021-02-21 | Disposition: A | Discharge status: Home / Self Care | Payer: Medicaid Other | Source: Ambulatory Visit | Attending: Family Medicine | Admitting: Family Medicine

## 2021-02-21 DIAGNOSIS — R87612 Low grade squamous intraepithelial lesion on cytologic smear of cervix (LGSIL): Secondary | ICD-10-CM

## 2021-02-21 DIAGNOSIS — Z30430 Encounter for insertion of intrauterine contraceptive device: Secondary | ICD-10-CM

## 2021-02-21 MED ORDER — LEVONORGESTREL 20.1 MCG/DAY IU IUD
1.0000 | INTRAUTERINE_SYSTEM | Freq: Once | INTRAUTERINE | Status: AC
  Administered 2021-02-21: 1 via INTRAUTERINE

## 2021-02-21 NOTE — Progress Notes (Signed)
Post Partum Visit Note  Lauren Hawkins is a 30 y.o. [id number] female who presents for a postpartum visit. She is 4 weeks postpartum following a normal spontaneous vaginal delivery.  I have fully reviewed the prenatal and intrapartum course. The delivery was at 38.2 gestational weeks.  Anesthesia: epidural. Postpartum course has been uneventful. Baby is doing well. Baby is feeding by bottle. Bleeding no bleeding. Bowel function is normal. Bladder function is normal. Patient is not sexually active. Contraception method is IUD. Postpartum depression screening: negative.   The pregnancy intention screening data noted above was reviewed. Potential methods of contraception were discussed. The patient elected to proceed with No data recorded.   Edinburgh Postnatal Depression Scale - 02/21/21 1527       Edinburgh Postnatal Depression Scale:  In the Past 7 Days   I have been able to laugh and see the funny side of things. 0    I have looked forward with enjoyment to things. 0    I have blamed myself unnecessarily when things went wrong. 2    I have been anxious or worried for no good reason. 1    I have felt scared or panicky for no good reason. 1    Things have been getting on top of me. 1    I have been so unhappy that I have had difficulty sleeping. 0    I have felt sad or miserable. 1    I have been so unhappy that I have been crying. 0    The thought of harming myself has occurred to me. 0    Edinburgh Postnatal Depression Scale Total 6             Health Maintenance Due  Topic Date Due   COVID-19 Vaccine (1) Never done   INFLUENZA VACCINE  Never done    The following portions of the patient's history were reviewed and updated as appropriate: allergies, current medications, past family history, past medical history, past social history, past surgical history, and problem list.  Review of Systems Pertinent items are noted in HPI.  Objective:  BP 116/89   Pulse 70   Ht 5\' 4"   (1.626 m)   Wt 216 lb (98 kg)   LMP 04/25/2020 (Exact Date)   BMI 37.08 kg/m    General:  alert, cooperative, and no distress   Breasts:  not indicated  Lungs: clear to auscultation bilaterally  Heart:  regular rate and rhythm, S1, S2 normal, no murmur, click, rub or gallop  Abdomen: soft, non-tender; bowel sounds normal; no masses,  no organomegaly   GU exam:  normal        IUD Procedure Note Patient identified, informed consent performed, signed copy in chart, time out was performed.  Urine pregnancy test negative.  Speculum placed in the vagina.  Cervix visualized.  Cleaned with Betadine x 2.  Paracervical block placed with Lidocaine 2% with epinephrine 60mL spread between the 12 o'clock position. Cervix grasped anteriorly with a single tooth tenaculum.  Uterus sounded to 9 cm.  Liletta  IUD placed per manufacturer's recommendations.  Strings trimmed to 3 cm. Tenaculum was removed, good hemostasis noted.  Patient tolerated procedure well.   Patient given post procedure instructions and Liletta care card with expiration date.  Patient is asked to check IUD strings periodically and follow up in 4-6 weeks for IUD check.    Assessment:   1. Postpartum exam   2. Encounter for IUD insertion   3.  LGSIL on Pap smear of cervix      Plan:   Essential components of care per ACOG recommendations:  1.  Mood and well being: Patient with negative depression screening today. Reviewed local resources for support.  - Patient tobacco use? No.   - hx of drug use? No.    2. Infant care and feeding:  -Patient currently breastmilk feeding? Yes. Reviewed importance of draining breast regularly to support lactation.  -Social determinants of health (SDOH) reviewed in EPIC. No concerns  3. Sexuality, contraception and birth spacing - Patient does not want a pregnancy in the next year.   - Reviewed forms of contraception in tiered fashion. Patient desired IUD today.   - Discussed birth spacing of  18 months  4. Sleep and fatigue -Encouraged family/partner/community support of 4 hrs of uninterrupted sleep to help with mood and fatigue  5. Physical Recovery  - Discussed patients delivery and complications. She describes her labor as good. - Patient had a Vaginal, no problems at delivery. Patient had a  1st  laceration. Perineal healing reviewed. Patient expressed understanding - Patient has urinary incontinence? No. - Patient is safe to resume physical and sexual activity  6.  Health Maintenance - HM due items addressed  - Last pap smear  07-12-2020 Diagnosis  Date Value Ref Range Status  07/12/2020 - Low grade squamous intraepithelial lesion (LSIL) (A)  Final  07/12/2020 - See comment (A)  Final   Pap smear done at today's visit.  -Breast Cancer screening indicated? No.   7. Chronic Disease/Pregnancy Condition follow up: None  - return in 4 weeks  Levie Heritage, DO [facility] [employer]

## 2021-03-01 LAB — CYTOLOGY - PAP
Comment: NEGATIVE
Comment: NEGATIVE
HPV 16: NEGATIVE
HPV 18 / 45: NEGATIVE
High risk HPV: POSITIVE — AB

## 2021-03-22 IMAGING — US US MFM OB TRANSVAGINAL
1 series · 14 of 28 positions shown · non-contrast
Comparison: none

[Series 1: us mfm ob transvaginal · 14 of 39 slices shown]
[im 2/39]
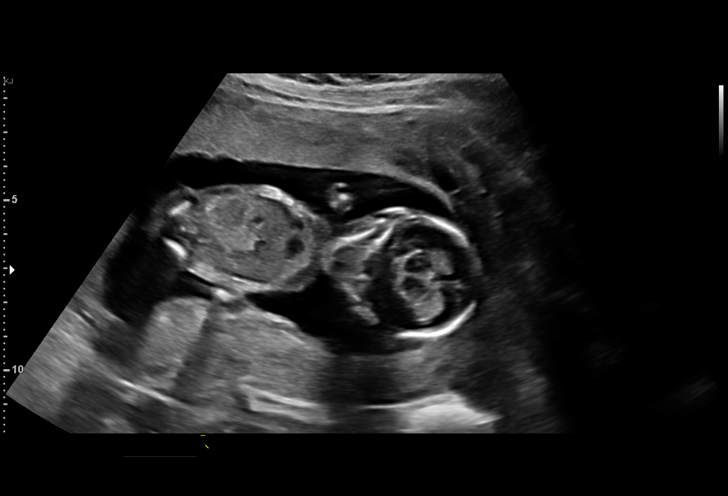
[im 5/39]
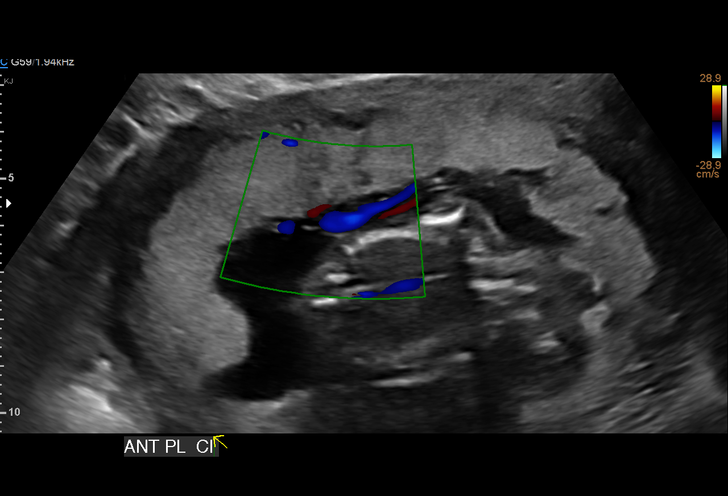
[im 8/39]
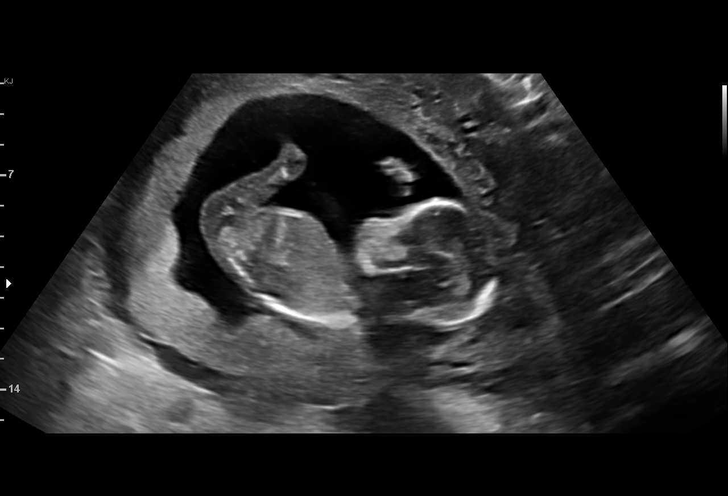
[im 10/39]
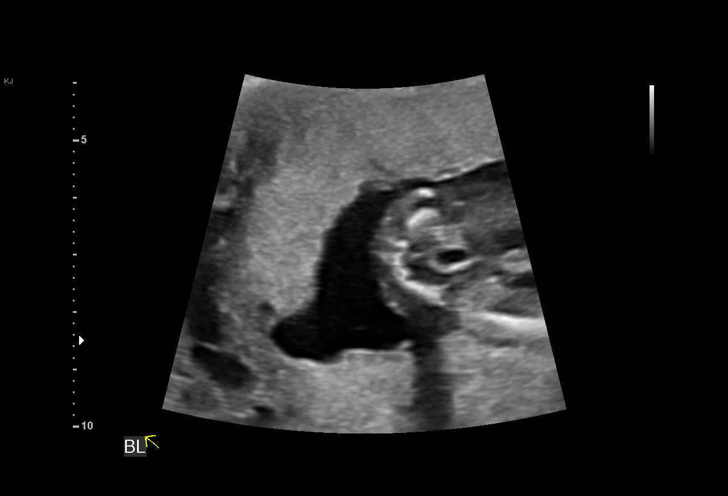
[im 13/39]
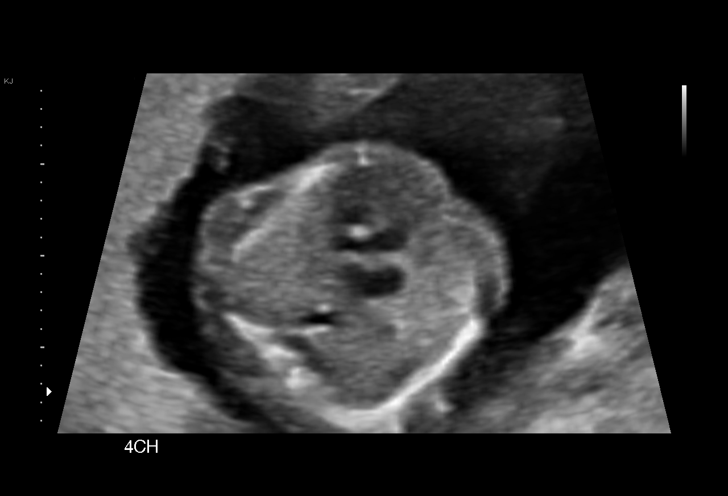
[im 16/39]
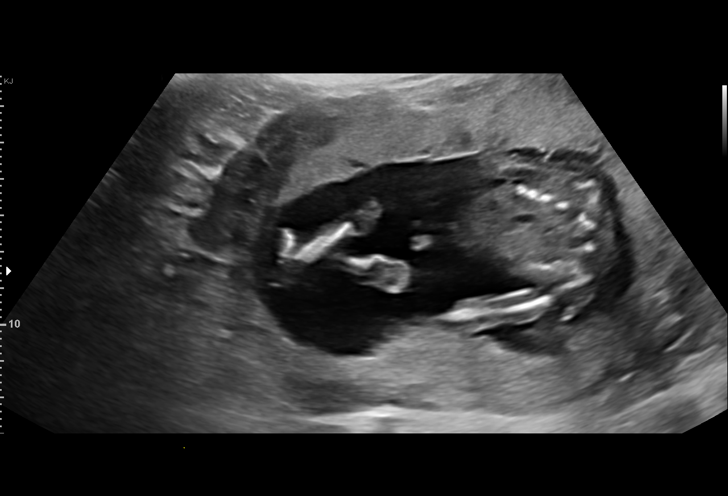
[im 19/39]
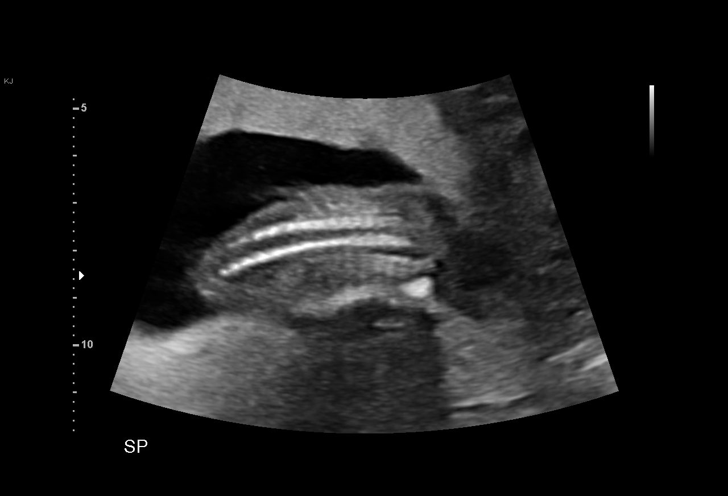
[im 22/39]
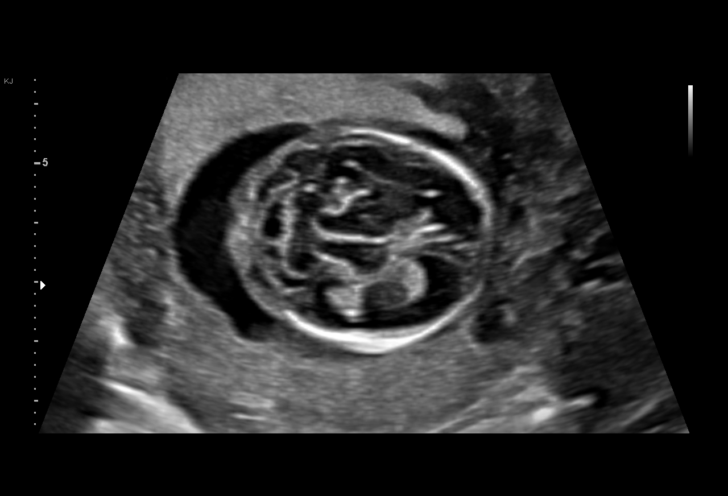
[im 24/39]
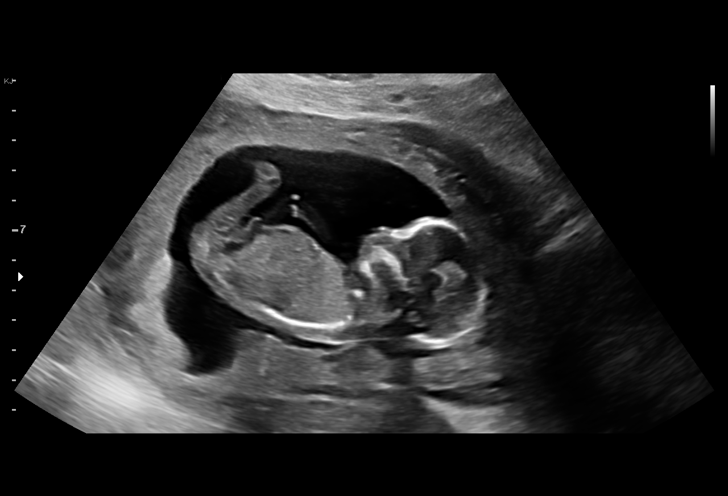
[im 27/39]
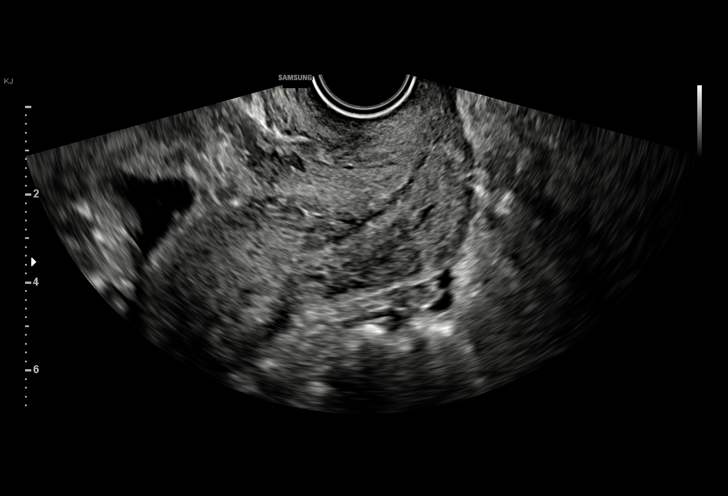
[im 30/39]
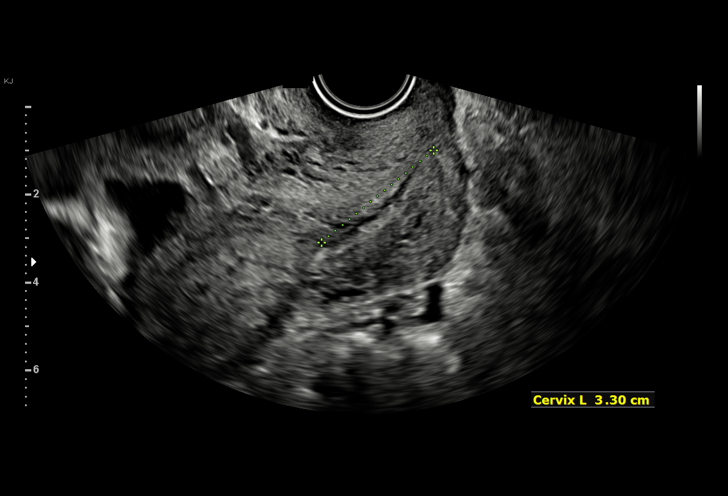
[im 33/39]
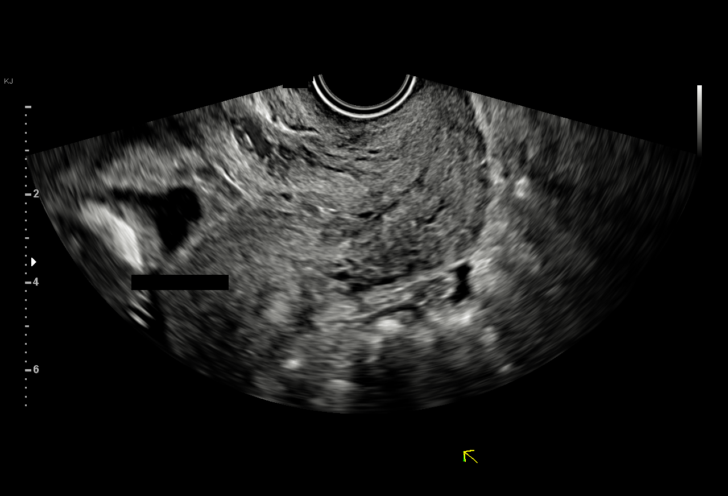
[im 36/39]
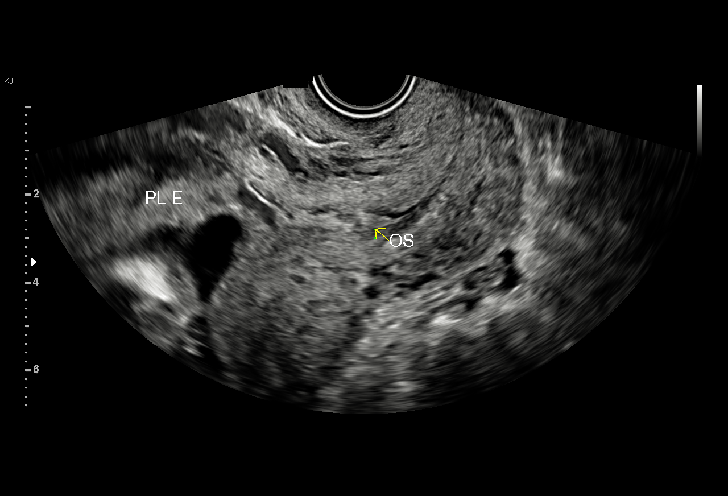
[im 39/39]
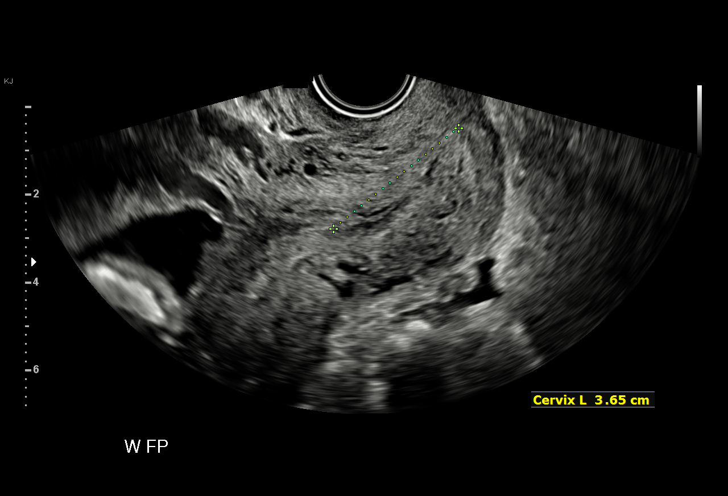

[14 of 28 positions shown; findings below may reference images not displayed]

Indications

 Poor obstetric history: Previous preterm
 delivery, antepartum (28 weeks); on Jae Hoon
 injections
 Obesity complicating pregnancy, second
 trimester
 LR NIPS
 16 weeks gestation of pregnancy
Fetal Evaluation

 Num Of Fetuses:         1
 Fetal Heart Rate(bpm):  155
 Cardiac Activity:       Observed
 Presentation:           Cephalic
 Placenta:               Anterior
 P. Cord Insertion:      Visualized

 Amniotic Fluid
 AFI FV:      Within normal limits
Gestational Age

 LMP:           16w 0d        Date:  04/25/20                 EDD:   01/30/21
 Best:          16w 0d     Det. By:  LMP  (04/25/20)          EDD:   01/30/21
Anatomy

 Cranium:               Seen                   Abdomen:                Seen
 Choroid Plexus:        Seen                   Abdominal Wall:         Seen
 Cerebellum:            Seen                   Kidneys:                Seen
 Posterior Fossa:       Seen                   Bladder:                Seen
 Nuchal Fold:           Seen                   Spine:                  Seen
 Heart:                 Seen                   Upper Extremities:      Seen
 Stomach:               Seen                   Lower Extremities:      Seen
Cervix Uterus Adnexa

 Cervix
 Length:           3.49  cm.
 Normal appearance by transvaginal scan.

 Adnexa
 No abnormality visualized.
Comments

 This patient was seen for a transvaginal cervical length
 measurement due to a prior spontaneous preterm birth at 28
 weeks.  She denies any significant past medical history and
 denies any problems in her current pregnancy.

 A viable singleton intrauterine pregnancy was noted today.

 A transvaginal ultrasound performed today shows that her
 cervical length was 3.49 cm long without any signs of
 funneling.  The patient is scheduled to start weekly 17-P
 injections tomorrow to decrease her risk of another preterm
 birth.  She should continue the weekly 17-P injections until 36
 weeks.

 A detailed fetal anatomy scan has been scheduled in our
 office on September 05, 2020.

## 2021-03-29 ENCOUNTER — Encounter: Payer: Medicaid Other | Admitting: Family Medicine

## 2021-04-11 ENCOUNTER — Encounter: Payer: Self-pay | Admitting: General Practice

## 2021-04-11 ENCOUNTER — Ambulatory Visit (INDEPENDENT_AMBULATORY_CARE_PROVIDER_SITE_OTHER): Payer: Medicaid Other | Admitting: Family Medicine

## 2021-04-11 ENCOUNTER — Other Ambulatory Visit (HOSPITAL_COMMUNITY)
Admission: RE | Admit: 2021-04-11 | Discharge: 2021-04-11 | Disposition: A | Discharge status: Home / Self Care | Payer: Medicaid Other | Source: Ambulatory Visit | Attending: Family Medicine | Admitting: Family Medicine

## 2021-04-11 ENCOUNTER — Encounter: Payer: Self-pay | Admitting: Family Medicine

## 2021-04-11 ENCOUNTER — Other Ambulatory Visit: Payer: Self-pay

## 2021-04-11 VITALS — BP 116/66 | HR 72 | Wt 219.0 lb

## 2021-04-11 DIAGNOSIS — R87612 Low grade squamous intraepithelial lesion on cytologic smear of cervix (LGSIL): Secondary | ICD-10-CM

## 2021-04-11 NOTE — Progress Notes (Signed)
Patient Name: Lauren Hawkins, female   DOB: 16-Mar-1991, 30 y.o.  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    Exam: Vulva and Vagina grossly normal.  Cervix viewed with speculum and colposcope after application of acetic acid:  Cervix Fully Visualized. IUD strings present Squamocolumnar Junction Visibility: Fully visualized  Acetowhite lesions: none  Other Lesions: None Punctation: Not present  Mosaicism: Not present Abnormal vasculature: No   Biopsies: none ECC: Yes - Curette and Brush  Hemostasis achieved with:   none  Colposcopy Impression:  CIN1   Patient was given post procedure instructions.  Will call patient with results.

## 2021-04-12 LAB — SURGICAL PATHOLOGY

## 2023-07-11 ENCOUNTER — Ambulatory Visit
Admission: EM | Admit: 2023-07-11 | Discharge: 2023-07-11 | Disposition: A | Discharge status: Home / Self Care | Payer: Medicaid Other | Attending: Internal Medicine | Admitting: Internal Medicine

## 2023-07-11 ENCOUNTER — Encounter: Payer: Self-pay | Admitting: Emergency Medicine

## 2023-07-11 DIAGNOSIS — M62838 Other muscle spasm: Secondary | ICD-10-CM

## 2023-07-11 MED ORDER — BACLOFEN 10 MG PO TABS: 10.0000 mg | ORAL_TABLET | Freq: Three times a day (TID) | ORAL | 0 refills | Status: AC

## 2023-07-11 MED ORDER — NAPROXEN 375 MG PO TABS: 375.0000 mg | ORAL_TABLET | Freq: Two times a day (BID) | ORAL | 0 refills | Status: AC
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
## 2023-09-15 ENCOUNTER — Encounter: Payer: Self-pay | Admitting: Skilled Nursing Facility1

## 2023-09-15 ENCOUNTER — Encounter: Attending: Physician Assistant | Admitting: Skilled Nursing Facility1

## 2023-09-15 ENCOUNTER — Ambulatory Visit: Admitting: Skilled Nursing Facility1

## 2023-09-15 VITALS — Ht 63.0 in | Wt 219.8 lb

## 2023-09-15 DIAGNOSIS — E66813 Obesity, class 3: Secondary | ICD-10-CM | POA: Diagnosis present

## 2023-09-15 DIAGNOSIS — Z6841 Body Mass Index (BMI) 40.0 and over, adult: Secondary | ICD-10-CM | POA: Diagnosis present

## 2023-09-15 NOTE — Progress Notes (Signed)
 Medical Nutrition Therapy  Appointment Start time:  11:22  Appointment End time:  12:20  Primary concerns today: weight loss  Referral diagnosis: e66.9   NUTRITION ASSESSMENT    Clinical Medical Hx:  Medications: diethylpropion  Labs:  Notable Signs/Symptoms: none reported   Lifestyle & Dietary Hx  Pt states she would like to be 180 pounds with her highest weight being 225 pounds.  Pt states she has been counting calories aiming for 1600 calories or less stating counting calories does not cause her extra stress.  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   Body Composition Scale 09/15/2023  Current Body Weight 219.8  Total Body Fat % 41.5  Visceral Fat 11  Fat-Free Mass % 58.4   Total Body Water % 43.7  Muscle-Mass lbs 32  BMI 38.5  Body Fat Displacement          Torso  lbs 56.5         Left Leg  lbs 11.3         Right Leg  lbs 11.3         Left Arm  lbs 5.6         Right Arm   lbs 5.6     Estimated daily fluid intake: 67 oz Supplements:  Sleep:  Stress / self-care:  Current average weekly physical activity: ADL's  24-Hr Dietary Recall First Meal: steak and eggs + wheat toast Snack: rice cake + fruit and honey Second meal: skipped or leftovers or fast food  Snack:  Third Meal: potato or rice + vegetable + protein  Snack:  Beverages: 3-4 water bottles + flavorings, 8-12 ounces juice or sweet tea   NUTRITION INTERVENTION  Nutrition education (E-1) on the following topics:  Creation of balanced and diverse meals to increase the intake of nutrient-rich foods that provide essential vitamins, minerals, fiber, and phytonutrients  Variety of Fruits and Vegetables:  Aim for a colorful array of fruits and vegetables to ensure a wide range of nutrients. Include a mix of leafy greens, berries, citrus fruits, cruciferous vegetables, and more. Whole Grains: Choose whole grains over refined grains. Examples include brown rice, quinoa, oats, whole wheat, and barley. Lean  Proteins: Include lean sources of protein, such as poultry, fish, tofu, legumes, beans, lentils, and low-fat dairy products. Limit red and processed meats. Healthy Fats: Incorporate sources of healthy fats, including avocados, nuts, seeds, and olive oil. Limit saturated and trans fats found in fried and processed foods. Dairy or Dairy Alternatives: Choose low-fat or fat-free dairy products, or plant-based alternatives like almond or soy milk. Portion Control: Be mindful of portion sizes to avoid overeating. Pay attention to hunger and satisfaction cues. Limit Added Sugars: Minimize the consumption of sugary beverages, snacks, and desserts. Check food labels for added sugars and opt for natural sources of sweetness such as whole fruits. Hydration: Drink plenty of water throughout the day. Limit sugary drinks and excessive caffeine intake. Moderate Sodium Intake: Reduce the consumption of high-sodium foods. Use herbs and spices for flavor instead of excessive salt. Meal Planning and Preparation: Plan and prepare meals ahead of time to make healthier choices more convenient. Include a mix of food groups in each meal. Limit Processed Foods: Minimize the intake of highly processed and packaged foods that are often high in added sugars, salt, and unhealthy fats. Regular Physical Activity: Combine a healthy diet with regular physical activity for overall well-being. Aim for at least 150 minutes of moderate-intensity aerobic exercise per week,  along with strength training. Moderation and Balance: Enjoy treats and indulgent foods in moderation, emphasizing balance rather than strict restriction.  Handouts Provided Include  Detailed MyPlate  Learning Style & Readiness for Change Teaching method utilized: Visual & Auditory  Demonstrated degree of understanding via: Teach Back  Barriers to learning/adherence to lifestyle change: none identified   Goals Established by Pt I will aim to drink  100 ounces mostly water  [place] job on reducing your sugary beverages! I will have a solo dance party about 30 minutes most days the week: Have fun!!   MONITORING & EVALUATION Dietary intake, weekly physical activity  Next Steps  Patient is to follow up in 4-6 weeks.

## 2023-10-27 ENCOUNTER — Ambulatory Visit: Admitting: Skilled Nursing Facility1

## 2023-10-30 ENCOUNTER — Other Ambulatory Visit: Payer: Self-pay

## 2023-10-30 ENCOUNTER — Ambulatory Visit
Admission: RE | Admit: 2023-10-30 | Discharge: 2023-10-30 | Disposition: A | Discharge status: Home / Self Care | Source: Ambulatory Visit

## 2023-10-30 VITALS — BP 112/79 | HR 88 | Temp 98.9°F | Resp 18 | Ht 64.0 in | Wt 215.0 lb

## 2023-10-30 DIAGNOSIS — R42 Dizziness and giddiness: Secondary | ICD-10-CM | POA: Diagnosis not present

## 2023-10-30 DIAGNOSIS — H6591 Unspecified nonsuppurative otitis media, right ear: Secondary | ICD-10-CM

## 2023-10-30 MED ORDER — SCOPOLAMINE 1 MG/3DAYS TD PT72: 1.0000 | MEDICATED_PATCH | TRANSDERMAL | 0 refills | Status: AC

## 2023-10-30 NOTE — ED Triage Notes (Signed)
 Pt presents with complaints of dizziness x 2 weeks. Pt states she had a common cold, blew her nose, believed this is what triggered the dizziness. Dizziness is still ongoing, feeling lightheaded and "overall just not my normal self, something is off." Pt currently denies pain, just voicing discomfort. Denies taking OTC medications for symptoms reported.

## 2023-10-30 NOTE — Discharge Instructions (Addendum)
 You were seen today for concerns of dizziness and lightheadedness. At this time I suspect this is secondary to some sinus inflammation and pressure that is likely causing compression to your eustachian tube.  To help with this I recommend starting an antihistamine and adding Flonase to your daily regimen.  I also recommend adding an antihistamine to your daily regimen This includes medications like Claritin, Allegra, Zyrtec- the generics of these work very well and are usually less expensive I recommend using Flonase nasal spray - 2 puffs twice per day to help with your nasal congestion The antihistamines and Flonase can take a few weeks to provide significant relief from allergy symptoms but should start to provide some benefit soon.  While these medications are taking effect I am sending in a medication called scopolamine.  This is a motion sickness patch that you can apply just behind your ear and leave on for about 3 days and then change out as needed.  This medication helps with motion sickness and may help a little bit with your dizziness and lightheadedness.  Please make sure that you are staying well-hydrated and eating regularly throughout the day as dehydration and low blood sugar can also cause the symptoms.  If you feel your symptoms are not improving or seem to be getting worse you can always return here or follow-up with your primary care provider for further evaluation and ongoing management.

## 2023-10-30 NOTE — ED Provider Notes (Signed)
 Lauren Hawkins    CSN: [id number] Arrival date & time: 10/30/23  1206      History   Chief Complaint Chief Complaint  Patient presents with   Dizziness    HPI [name] is a 33 y.o. female.   HPI  Pt reports she had a cold a few weeks ago and she blew her nose while driving. She states immediately after blowing her nose she developed severe dizziness and had to pull off the road to collect herself She denies pain but reports continued dizziness, lightheadedness as well as some neck tension  She denies nausea, vomiting She reports that quick head changes while looking around seem to make her feel worse She reports that she sometimes has the increased dizziness when standing up fast but this is not always a trigger   Interventions: she was previously taking Tylenol  sinus and headache but is not taking this currently   Past Medical History:  Diagnosis Date   Medical history non-contributory     Patient Active Problem List   Diagnosis Date Noted   Class 3 severe obesity without serious comorbidity with body mass index (BMI) of 40.0 to 44.9 in adult 12/14/2020   LGSIL on Pap smear of cervix 07/20/2020   Sciatica of left side 09/09/2018   ASCUS with positive high risk HPV cervical 03/11/2018    Past Surgical History:  Procedure Laterality Date   INDUCED ABORTION      OB History     Gravida  3   Para  2   Term  1   Preterm  1   AB  1   Living  2      SAB      IAB  1   Ectopic      Multiple  0   Live Births  2            Home Medications    Prior to Admission medications   Medication Sig Start Date End Date Taking? Authorizing Provider  scopolamine (TRANSDERM-SCOP) 1 MG/3DAYS Place 1 patch (1.5 mg total) onto the skin every 3 (three) days. 10/30/23  Yes Araly Kaas E, PA-C  WEGOVY 0.5 MG/0.5ML SOAJ  06/18/23  Yes [provider]  baclofen  (LIORESAL ) 10 MG tablet Take 1 tablet (10 mg total) by mouth 3 (three) times daily.  07/11/23   Starlene Eaton, FNP  baclofen  (LIORESAL ) 10 MG tablet Take 1 tablet (10 mg total) by mouth 3 (three) times daily. 07/11/23   Starlene Eaton, FNP  naproxen  (NAPROSYN ) 375 MG tablet Take 1 tablet (375 mg total) by mouth 2 (two) times daily. 07/11/23   Starlene Eaton, FNP  Prenatal Vit-Fe Fumarate-FA (PRENATAL VITAMINS PO) Take by mouth.    [provider]    Family History Family History  Problem Relation Age of Onset   Hypertension Mother     Social History Social History   Tobacco Use   Smoking status: Never   Smokeless tobacco: Never  Vaping Use   Vaping status: Never Used  Substance Use Topics   Alcohol use: Not Currently   Drug use: Not Currently     Allergies   Patient has no known allergies.   Review of Systems Review of Systems  Constitutional:  Negative for chills and fever.  HENT:  Positive for sinus pressure and sinus pain. Negative for ear discharge and ear pain.   Eyes:  Negative for photophobia, pain and visual disturbance.  Gastrointestinal:  Negative for  nausea and vomiting.  Musculoskeletal:  Positive for neck stiffness.  Neurological:  Positive for dizziness, light-headedness and headaches (mild).     Physical Exam Triage Vital Signs ED Triage Vitals  Encounter Vitals Group     BP 10/30/23 1244 112/79     Systolic BP Percentile --      Diastolic BP Percentile --      Pulse Rate 10/30/23 1244 88     Resp 10/30/23 1244 18     Temp 10/30/23 1244 98.9 F (37.2 C)     Temp Source 10/30/23 1244 Oral     SpO2 10/30/23 1244 97 %     Weight 10/30/23 1244 215 lb (97.5 kg)     Height 10/30/23 1244 5\' 4"  (1.626 m)     Head Circumference --      Peak Flow --      Pain Score 10/30/23 1301 0     Pain Loc --      Pain Education --      Exclude from Growth Chart --    No data found.  Updated Vital Signs BP 112/79 (BP Location: Right Arm)   Pulse 88   Temp 98.9 F (37.2 C) (Oral)   Resp 18   Ht 5\' 4"  (1.626 m)    Wt 215 lb (97.5 kg)   LMP 10/11/2023 (Exact Date)   SpO2 97%   BMI 36.90 kg/m   Visual Acuity Right Eye Distance:   Left Eye Distance:   Bilateral Distance:    Right Eye Near:   Left Eye Near:    Bilateral Near:     Physical Exam Vitals reviewed.  Constitutional:      General: She is awake. She is not in acute distress.    Appearance: Normal appearance. She is well-developed and well-groomed. She is not ill-appearing or toxic-appearing.  HENT:     Head: Normocephalic and atraumatic.     Right Ear: Hearing, tympanic membrane and ear canal normal.     Left Ear: Hearing, tympanic membrane and ear canal normal.     Mouth/Throat:     Lips: Pink.     Mouth: Mucous membranes are moist.     Pharynx: Oropharynx is clear. Uvula midline. No pharyngeal swelling, oropharyngeal exudate, posterior oropharyngeal erythema, uvula swelling or postnasal drip.  Eyes:     General: Lids are normal. Gaze aligned appropriately.     Extraocular Movements: Extraocular movements intact.     Right eye: No nystagmus.     Left eye: No nystagmus.     Conjunctiva/sclera: Conjunctivae normal.     Pupils: Pupils are equal, round, and reactive to light.  Cardiovascular:     Rate and Rhythm: Normal rate and regular rhythm.     Pulses: Normal pulses.          Radial pulses are 2+ on the right side and 2+ on the left side.     Heart sounds: Normal heart sounds. No murmur heard.    No friction rub. No gallop.  Pulmonary:     Effort: Pulmonary effort is normal.     Breath sounds: Normal breath sounds. No decreased air movement. No decreased breath sounds, wheezing, rhonchi or rales.  Musculoskeletal:     Cervical back: Normal range of motion and neck supple.  Lymphadenopathy:     Head:     Right side of head: No submental, submandibular or preauricular adenopathy.     Left side of head: No submental, submandibular or preauricular adenopathy.  Cervical:     Right cervical: No superficial cervical  adenopathy.    Left cervical: No superficial cervical adenopathy.     Upper Body:     Right upper body: No supraclavicular adenopathy.     Left upper body: No supraclavicular adenopathy.  Skin:    General: Skin is warm and dry.  Neurological:     General: No focal deficit present.     Mental Status: She is alert and oriented to person, place, and time.     GCS: GCS eye subscore is 4. GCS verbal subscore is 5. GCS motor subscore is 6.     Cranial Nerves: No cranial nerve deficit, dysarthria or facial asymmetry.     Motor: No weakness, tremor or atrophy.     Gait: Gait is intact.  Psychiatric:        Mood and Affect: Mood normal.        Behavior: Behavior normal. Behavior is cooperative.      Hawkins Treatments / Results  Labs (all labs ordered are listed, but only abnormal results are displayed) Labs Reviewed - No data to display  EKG   Radiology No results found.  Procedures Procedures (including critical care time)  Medications Ordered in Hawkins Medications - No data to display  Initial Impression / Assessment and Plan / Hawkins Course  I have reviewed the triage vital signs and the nursing notes.  Pertinent labs & imaging results that were available during my care of the patient were reviewed by me and considered in my medical decision making (see chart for details).      Final Clinical Impressions(s) / Hawkins Diagnoses   Final diagnoses:  Dizziness and giddiness  Middle ear effusion, right   Patient presents today with concerns of dizziness and lightheadedness for about a week and a half.  She reports that symptoms started after blowing her nose during a upper respiratory tract infection and she has had persistent dizziness since then.  Physical exam is notable for mild middle ear effusion on the right side with some erythema but no signs of active infection.  At this time I suspect likely eustachian tube dysfunction as contributing to dizziness and headache.  Recommend starting  second-generation antihistamine as well as Flonase to assist with symptoms.  Will send in scopolamine Motion sickness patches to assist with symptoms while OTC medications are reaching therapeutic effect.  Recommend follow-up with primary care provider should symptoms remain persistent or start to worsen.  Follow-up as needed    Discharge Instructions      You were seen today for concerns of dizziness and lightheadedness. At this time I suspect this is secondary to some sinus inflammation and pressure that is likely causing compression to your eustachian tube.  To help with this I recommend starting an antihistamine and adding Flonase to your daily regimen.  I also recommend adding an antihistamine to your daily regimen This includes medications like Claritin, Allegra, Zyrtec- the generics of these work very well and are usually less expensive I recommend using Flonase nasal spray - 2 puffs twice per day to help with your nasal congestion The antihistamines and Flonase can take a few weeks to provide significant relief from allergy symptoms but should start to provide some benefit soon.  While these medications are taking effect I am sending in a medication called scopolamine.  This is a motion sickness patch that you can apply just behind your ear and leave on for about 3 days and then change  out as needed.  This medication helps with motion sickness and may help a little bit with your dizziness and lightheadedness.  Please make sure that you are staying well-hydrated and eating regularly throughout the day as dehydration and low blood sugar can also cause the symptoms.  If you feel your symptoms are not improving or seem to be getting worse you can always return here or follow-up with your primary care provider for further evaluation and ongoing management.     ED Prescriptions     Medication Sig Dispense Auth. Provider   scopolamine (TRANSDERM-SCOP) 1 MG/3DAYS Place 1 patch (1.5 mg total)  onto the skin every 3 (three) days. 10 patch Levell Tavano E, PA-C      PDMP not reviewed this encounter.   Christy Ehrsam, Pearla Bottom, PA-C 10/30/23 1505

## 2023-11-04 ENCOUNTER — Telehealth: Payer: Self-pay

## 2023-11-25 ENCOUNTER — Ambulatory Visit: Admitting: Skilled Nursing Facility1

## 2023-12-23 ENCOUNTER — Ambulatory Visit: Admitting: Skilled Nursing Facility1

## 2024-02-24 ENCOUNTER — Ambulatory Visit (INDEPENDENT_AMBULATORY_CARE_PROVIDER_SITE_OTHER): Admitting: Obstetrics and Gynecology

## 2024-02-24 ENCOUNTER — Other Ambulatory Visit (HOSPITAL_COMMUNITY)
Admission: RE | Admit: 2024-02-24 | Discharge: 2024-02-24 | Disposition: A | Source: Ambulatory Visit | Attending: Obstetrics and Gynecology | Admitting: Obstetrics and Gynecology

## 2024-02-24 VITALS — BP 111/64 | HR 87 | Ht 64.0 in | Wt 218.0 lb

## 2024-02-24 DIAGNOSIS — Z975 Presence of (intrauterine) contraceptive device: Secondary | ICD-10-CM | POA: Insufficient documentation

## 2024-02-24 DIAGNOSIS — Z124 Encounter for screening for malignant neoplasm of cervix: Secondary | ICD-10-CM | POA: Insufficient documentation

## 2024-02-24 DIAGNOSIS — Z23 Encounter for immunization: Secondary | ICD-10-CM

## 2024-02-24 DIAGNOSIS — L02429 Furuncle of limb, unspecified: Secondary | ICD-10-CM

## 2024-02-24 DIAGNOSIS — N898 Other specified noninflammatory disorders of vagina: Secondary | ICD-10-CM | POA: Diagnosis present

## 2024-02-24 DIAGNOSIS — Z01419 Encounter for gynecological examination (general) (routine) without abnormal findings: Secondary | ICD-10-CM

## 2024-02-24 MED ORDER — CLINDAMYCIN PHOSPHATE 1 % EX SOLN: Freq: Two times a day (BID) | CUTANEOUS | 1 refills | Status: AC

## 2024-02-24 NOTE — Progress Notes (Signed)
 ANNUAL EXAM Patient name: Lauren Hawkins MRN [id number]  Date of birth: 1990-06-21 Chief Complaint:   No chief complaint on file.  History of Present Illness:   Lauren Hawkins is a 33 y.o. [id number]  female being seen today for a routine annual exam.  Current complaints: annual for repeat pap   has a boil on thigh, does not hurt, sometimes will have drainage  Patient's last menstrual period was 02/12/2024 (exact date).   The pregnancy intention screening data noted above was reviewed. Potential methods of contraception were discussed. The patient elected to proceed with No data recorded.   Gynecologic History Patient's last menstrual period was . Contraception:  Last Pap: 02/21/21. LSIL HPV+, colpo benign  Last mammogram:n/a HPV: not sure if she has ever been vaccinated     02/24/2024   10:07 AM 09/15/2023   11:31 AM 01/15/2021    4:14 PM 11/02/2020    8:46 AM 01/26/2019    9:28 AM  Depression screen PHQ 2/9  Decreased Interest 1 0 1 0 2  Down, Depressed, Hopeless 1 0 1 0 2  PHQ - 2 Score 2 0 2 0 4  Altered sleeping 0  1 0 2  Tired, decreased energy 1  1 1 3   Change in appetite 0  0 0 2  Feeling bad or failure about yourself  0  0 0 1  Trouble concentrating 1  0 0 1  Moving slowly or fidgety/restless 0  0 0 1  Suicidal thoughts 0  0 0 0  PHQ-9 Score 4  4 1 14   Difficult doing work/chores    Not difficult at all         02/24/2024   10:07 AM 01/15/2021    4:15 PM 11/02/2020    8:46 AM 01/26/2019    9:31 AM  GAD 7 : Generalized Anxiety Score  Nervous, Anxious, on Edge 0 1 1 3   Control/stop worrying 0 0 0 3  Worry too much - different things 1 1 1 3   Trouble relaxing 0 1 0 1  Restless 0 0 1 1  Easily annoyed or irritable 1 1 1 3   Afraid - awful might happen 1 0 0 3  Total GAD 7 Score 3 4 4 17      Review of Systems:   Pertinent items are noted in HPI Denies any headaches, blurred vision, fatigue, shortness of breath, chest pain, abdominal pain, abnormal vaginal  discharge/itching/odor/irritation, problems with periods, bowel movements, urination, or intercourse unless otherwise stated above. Pertinent History Reviewed:  Reviewed past medical,surgical, social and family history.  Reviewed problem list, medications and allergies. Physical Assessment:   Vitals:   02/24/24 0953  BP: 111/64  Pulse: 87  Weight: 218 lb (98.9 kg)  Height: 5' 4 (1.626 m)  Body mass index is 37.42 kg/m.        Physical Examination:   General appearance - well appearing, and in no distress  Mental status - alert, oriented   Psych:  She has a normal mood and affect  Skin - warm and dry, normal color  Chest - effort normal, all lung fields clear to auscultation bilaterally  Heart - normal rate and regular rhythm  Neck:  midline trachea, no thyromegaly or nodules  Breasts - breasts appear normal, no suspicious masses, no skin or nipple changes or  axillary nodes  Abdomen - soft, nontender, nondistended  Pelvic - VULVA: normal appearing vulva with no masses, tenderness or lesions  VAGINA: normal appearing vagina  with normal color and discharge, no lesions  CERVIX: normal appearing cervix without discharge or lesions, no CMT  Thin prep pap is done w HR HPV cotesting  UTERUS: uterus is felt to be normal size, shape, consistency and nontender   ADNEXA: No adnexal masses or tenderness noted.  Extremities:  No swelling or varicosities noted  Chaperone present for exam  No results found for this or any previous visit (from the past 24 hours).  Assessment & Plan:  1. Encounter for annual routine gynecological examination (Primary) Encourage self breast exam Annual blood with with her PCP Mammogram at 40  Desire HPV vaccination today, discussed three dose series, first dose today, will return in two months   2. Cervical cancer screening Repeat pap today, benign colpo 2022 - Cytology - PAP( [place])  3. Boil, thigh Discussed warm compress, trial cleocin. Does not  warrant I&D today  - clindamycin (CLEOCIN T) 1 % external solution; Apply topically 2 (two) times daily.  Dispense: 30 mL; Refill: 1  4. Vaginal discharge  - Cervicovaginal ancillary only( [place])  5. IUD (intrauterine device) in place  In place, strings visualized Liletta  Expire 2030 unless desires removal sooner   Labs/procedures today:   Mammogram: @ 33yo, or sooner if problems Colonoscopy: @ 33yo, or sooner if problems  Orders Placed This Encounter  Procedures   HPV 9-valent vaccine,Recombinat    Meds:  Meds ordered this encounter  Medications   clindamycin (CLEOCIN T) 1 % external solution    Sig: Apply topically 2 (two) times daily.    Dispense:  30 mL    Refill:  1    Follow-up: Return 2 months nurse visit 2nd HPV vaccine then 1 year annual.  Nidia Daring, FNP

## 2024-02-24 NOTE — Progress Notes (Deleted)
 ANNUAL EXAM Patient name: Threasa Kinch MRN [id number]  Date of birth: 09-25-1990 Chief Complaint:   No chief complaint on file.  History of Present Illness:   Lauren Hawkins is a 33 y.o. [id number] {race:25618} female being seen today for a routine annual exam.  Current complaints: ***  Patient's last menstrual period was 02/12/2024 (exact date).   The pregnancy intention screening data noted above was reviewed. Potential methods of contraception were discussed. The patient elected to proceed with No data recorded.   Last pap ***. Results were: {Pap findings:25134}. H/O abnormal pap: {yes/yes***/no:23866} Last mammogram: ***. Results were: {normal, abnormal, n/a:23837}. Family h/o breast cancer: {yes***/no:23838} Last colonoscopy: ***. Results were: {normal, abnormal, n/a:23837}. Family h/o colorectal cancer: {yes***/no:23838}     02/24/2024   10:07 AM 09/15/2023   11:31 AM 01/15/2021    4:14 PM 11/02/2020    8:46 AM 01/26/2019    9:28 AM  Depression screen PHQ 2/9  Decreased Interest 1 0 1 0 2  Down, Depressed, Hopeless 1 0 1 0 2  PHQ - 2 Score 2 0 2 0 4  Altered sleeping 0  1 0 2  Tired, decreased energy 1  1 1 3   Change in appetite 0  0 0 2  Feeling bad or failure about yourself  0  0 0 1  Trouble concentrating 1  0 0 1  Moving slowly or fidgety/restless 0  0 0 1  Suicidal thoughts 0  0 0 0  PHQ-9 Score 4  4 1 14   Difficult doing work/chores    Not difficult at all         01/15/2021    4:15 PM 11/02/2020    8:46 AM 01/26/2019    9:31 AM  GAD 7 : Generalized Anxiety Score  Nervous, Anxious, on Edge 1 1 3   Control/stop worrying 0 0 3  Worry too much - different things 1 1 3   Trouble relaxing 1 0 1  Restless 0 1 1  Easily annoyed or irritable 1 1 3   Afraid - awful might happen 0 0 3  Total GAD 7 Score 4 4 17      Review of Systems:   Pertinent items are noted in HPI Denies any headaches, blurred vision, fatigue, shortness of breath, chest pain, abdominal pain, abnormal  vaginal discharge/itching/odor/irritation, problems with periods, bowel movements, urination, or intercourse unless otherwise stated above.  Pertinent History Reviewed:  Reviewed past medical,surgical, social and family history.  Reviewed problem list, medications and allergies.  Physical Assessment:   Vitals:   02/24/24 0953  BP: 111/64  Pulse: 87  Weight: 218 lb (98.9 kg)  Height: 5' 4 (1.626 m)   Body mass index is 37.42 kg/m.   Physical Examination:  General appearance - well appearing, and in no distress Mental status - alert, oriented to person, place, and time Psych:  She has a normal mood and affect Skin - warm and dry, normal color, no suspicious lesions noted Chest - effort normal, no problems with respiration noted Heart - normal rate and regular rhythm Neck:  midline trachea, no thyromegaly or nodules Breasts - breasts appear normal, no suspicious masses, no skin or nipple changes or  axillary nodes Abdomen - soft, nontender, nondistended, no masses or organomegaly Pelvic - VULVA: normal appearing vulva with no masses, tenderness or lesions   VAGINA: normal appearing vagina with normal color and discharge, no lesions   CERVIX: normal appearing cervix without discharge or lesions, no CMT Thin prep pap is {  Desc; done/not:10129} *** HR HPV cotesting Extremities:  No swelling or varicosities noted  Chaperone present for exam  No results found for this or any previous visit (from the past 24 hours).  Assessment & Plan:      There are no diagnoses linked to this encounter. - Will follow up results of pap smear and manage accordingly. - Mammogram scheduled - Colon cancer screening is up to date***Referral made to Gastroenterology for colonoscopy***discussed Cologuard vs Colonoscopy details and patient will decide and let us  know her decision. - Routine preventative health maintenance measures emphasized. - Please refer to After Visit Summary for other counseling  recommendations.       Mammogram: {Mammo f/u:25212::@ 33yo}, or sooner if problems Colonoscopy: {TCS f/u:25213::@ 33yo}, or sooner if problems  No orders of the defined types were placed in this encounter.   Meds: No orders of the defined types were placed in this encounter.   Follow-up: No follow-ups on file.  Tommy Daring, RN 02/24/2024 10:07 AM

## 2024-02-25 LAB — CERVICOVAGINAL ANCILLARY ONLY
Bacterial Vaginitis (gardnerella): POSITIVE — AB
Candida Glabrata: NEGATIVE
Candida Vaginitis: NEGATIVE
Comment: NEGATIVE
Comment: NEGATIVE
Comment: NEGATIVE

## 2024-02-26 ENCOUNTER — Ambulatory Visit: Payer: Self-pay | Admitting: Obstetrics and Gynecology

## 2024-02-26 LAB — CYTOLOGY - PAP
Comment: NEGATIVE
Comment: NEGATIVE
Comment: NEGATIVE
Diagnosis: HIGH — AB
HPV 16: NEGATIVE
HPV 18 / 45: NEGATIVE
High risk HPV: POSITIVE — AB

## 2024-02-26 MED ORDER — METRONIDAZOLE 500 MG PO TABS: 500.0000 mg | ORAL_TABLET | Freq: Two times a day (BID) | ORAL | 0 refills | Status: AC

## 2024-03-01 NOTE — Telephone Encounter (Signed)
 Patient has been called and scheduled with Dr. Abigail on 10/31.

## 2024-03-01 NOTE — Telephone Encounter (Signed)
-----   Message from Nurse Erminio DEL sent at 03/01/2024  8:56 AM EDT ----- Patient has been notified.  Please schedule.  Erminio DELENA Rumps ----- Message ----- From: Delores Nidia CROME, FNP Sent: 02/29/2024   9:38 PM EDT To: [facility] Admin  LSIL, HPV +, can we get her scheduled for colposcopy  ----- Message ----- From: Interface, Lab In [facility] Sent: 02/25/2024  10:42 AM EDT To: Nidia CROME Delores, FNP

## 2024-03-26 ENCOUNTER — Ambulatory Visit: Admitting: Obstetrics and Gynecology

## 2024-03-26 ENCOUNTER — Other Ambulatory Visit (HOSPITAL_COMMUNITY)
Admission: RE | Admit: 2024-03-26 | Discharge: 2024-03-26 | Disposition: A | Discharge status: Home / Self Care | Source: Ambulatory Visit | Attending: Obstetrics and Gynecology | Admitting: Obstetrics and Gynecology

## 2024-03-26 VITALS — BP 110/70 | HR 87 | Ht 64.0 in | Wt 219.1 lb

## 2024-03-26 DIAGNOSIS — R87613 High grade squamous intraepithelial lesion on cytologic smear of cervix (HGSIL): Secondary | ICD-10-CM

## 2024-03-26 DIAGNOSIS — Z3202 Encounter for pregnancy test, result negative: Secondary | ICD-10-CM

## 2024-03-26 DIAGNOSIS — Z3200 Encounter for pregnancy test, result unknown: Secondary | ICD-10-CM

## 2024-03-26 LAB — POCT URINE PREGNANCY: Preg Test, Ur: NEGATIVE

## 2024-03-26 NOTE — Progress Notes (Signed)
    GYNECOLOGY OFFICE COLPOSCOPY PROCEDURE NOTE  33 y.o. [id number] here for colposcopy for HSIL pap smear on 02/24/24. Discussed role for HPV in cervical dysplasia, need for surveillance.  Dysplasia history: 01/2024: HSIL/HPV positive (other) 03/2021: colpo with benign ECC 01/2021: LSIL/HPV positive (other) 06/2020: LSIL  Reviewed the risks of pain, bleeding, infection, inadequate sample. Patient gave informed written consent, time out was performed.  Placed in lithotomy position. Cervix viewed with speculum and colposcope after application of acetic acid. IUD strings present. UPT negative.  Colposcopy adequate? Yes  acetowhite lesion(s) noted at 5 and 12 o'clock; corresponding biopsies obtained.  ECC specimen obtained. All specimens were labeled and sent to pathology.  Chaperone was present during entire procedure.  Patient was given post procedure instructions.  Will follow up pathology and manage accordingly; patient will be contacted with results and recommendations.    I have advised her to schedule LEEP based on her HSIL pap and reviewed the procedure with her today.   Rollo ONEIDA Bring, MD, FACOG Obstetrician & Gynecologist, [facility] for [facility], [facility]

## 2024-03-29 LAB — SURGICAL PATHOLOGY

## 2024-03-30 ENCOUNTER — Ambulatory Visit: Payer: Self-pay | Admitting: Obstetrics and Gynecology

## 2024-04-20 ENCOUNTER — Ambulatory Visit

## 2024-04-26 ENCOUNTER — Other Ambulatory Visit (HOSPITAL_COMMUNITY)
Admission: RE | Admit: 2024-04-26 | Discharge: 2024-04-26 | Disposition: A | Source: Ambulatory Visit | Attending: Obstetrics and Gynecology | Admitting: Obstetrics and Gynecology

## 2024-04-26 ENCOUNTER — Ambulatory Visit: Admitting: Obstetrics and Gynecology

## 2024-04-26 VITALS — BP 109/63 | HR 80 | Ht 64.0 in | Wt 224.0 lb

## 2024-04-26 DIAGNOSIS — L02429 Furuncle of limb, unspecified: Secondary | ICD-10-CM

## 2024-04-26 DIAGNOSIS — D069 Carcinoma in situ of cervix, unspecified: Secondary | ICD-10-CM | POA: Diagnosis present

## 2024-04-26 DIAGNOSIS — Z3200 Encounter for pregnancy test, result unknown: Secondary | ICD-10-CM

## 2024-04-26 DIAGNOSIS — Z23 Encounter for immunization: Secondary | ICD-10-CM

## 2024-04-26 LAB — POCT URINE PREGNANCY: Preg Test, Ur: NEGATIVE

## 2024-04-26 NOTE — Addendum Note (Signed)
 Addended by: JOMARIE SKIPPER D on: 04/26/2024 02:31 PM   Modules accepted: Orders

## 2024-04-26 NOTE — Progress Notes (Signed)
   GYNECOLOGY OFFICE PROCEDURE NOTE  Lauren Hawkins is a 33 y.o. [id number] here for LEEP. No GYN concerns. Pap smear and colposcopy history reviewed.    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  Role of HPV,cervical dysplasia and need for close followup was empasized. Reviewed risk of cutting IUD strings during procedure. Informed written consent was obtained. All questions were answered. Time out performed. Urine pregnancy test was Negative.  ??Procedure: The patient was placed in lithotomy position and the bivalved coated speculum was placed in the patient's vagina. A grounding pad placed on the patient. Acetic acid was applied to the cervix and the colposcopy was repeated.   Local anesthesia was administered via an intracervical block using 10 ml of 2% Lidocaine  with epinephrine. The suction was turned on and the Small 1X Fisher Cone Biopsy Excisor on 35 Watts of blended current was used to excise the entire transformation zone. Excellent hemostasis was achieved using roller ball coagulation set at 35 Watts coagulation current. ECC collected. Monsel's solution was then applied and the speculum was removed from the vagina. IUD strings noted to be cut during procedure- discussed with patient. Specimens were sent to pathology. EBL approximately 25 ml.  ?The patient tolerated the procedure well. Post-operative instructions given to patient in detail. She has a 4 week post-operative check to assess wound healing, review results and discuss further management.    Rollo ONEIDA Bring, MD,  FACOG Obstetrician & Gynecologist, [facility] for [facility], [facility]

## 2024-04-29 ENCOUNTER — Ambulatory Visit: Payer: Self-pay | Admitting: Obstetrics and Gynecology

## 2024-04-29 LAB — SURGICAL PATHOLOGY

## 2024-06-07 ENCOUNTER — Ambulatory Visit: Admitting: Obstetrics and Gynecology

## 2024-06-07 VITALS — BP 105/75 | HR 101 | Wt 211.0 lb

## 2024-06-07 DIAGNOSIS — Z9889 Other specified postprocedural states: Secondary | ICD-10-CM

## 2024-06-07 DIAGNOSIS — D069 Carcinoma in situ of cervix, unspecified: Secondary | ICD-10-CM

## 2024-06-07 NOTE — Progress Notes (Signed)
" ° °  ESTABLISHED GYNECOLOGY VISIT Chief Complaint  Patient presents with   Follow-up    Subjective:  Lauren Hawkins is a 34 y.o. [id number] presenting for LEEP follow up.  Doing well, no concerns. Reports no longer bleeding, no abnormal discharge or pelvic pain. No concerns.  LEEP pathology report: FINAL MICROSCOPIC DIAGNOSIS:  A. 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 - Margins negative for dysplasia.  B. ENDOCERVIX, CURETTAGE: - Blood only.  Insufficient tissue for diagnosis.    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  Reviewed problem list, medications and allergies.  Objective:   Vitals:   06/07/24 1135  BP: 105/75  Pulse: (!) 101  Weight: 211 lb 0.6 oz (95.7 kg)   Physical Examination:   General appearance - well appearing, and in no distress  Mental status - alert, oriented to person, place, and time  Psych:  normal mood and affect  Skin - warm and dry, normal color, no suspicious lesions noted  Pelvic -  VULVA: normal appearing vulva with no masses, tenderness or lesions   VAGINA: normal appearing vagina with normal color and discharge, no lesions   CERVIX: normal appearing cervix without discharge or lesions  Chaperone present for exam  Assessment and Plan:  1. S/P LEEP (Primary) Healing well Pathology report reviewed Margins negative Repeat pap/HPV in 6 months (10/25/2024)  2. High grade squamous intraepithelial lesion (HGSIL), grade 3 CIN, on biopsy of cervix     Return in about 20 weeks (around 10/25/2024).  No future appointments.  Rollo ONEIDA Bring, MD,  FACOG Obstetrician & Gynecologist, [facility] for [facility], [facility] "
